# Patient Record
Sex: Female | Born: 1990 | Race: White | Hispanic: No | Marital: Married | Smoking: Never smoker
Health system: Southern US, Community
[De-identification: ages and names within clinical notes are randomized; demographics above are authoritative.]

## PROBLEM LIST (undated history)

## (undated) DIAGNOSIS — O139 Gestational [pregnancy-induced] hypertension without significant proteinuria, unspecified trimester: Secondary | ICD-10-CM

## (undated) DIAGNOSIS — J810 Acute pulmonary edema: Secondary | ICD-10-CM

## (undated) DIAGNOSIS — Z98891 History of uterine scar from previous surgery: Secondary | ICD-10-CM

## (undated) HISTORY — PX: OTHER SURGICAL HISTORY: SHX169

## (undated) HISTORY — PX: WISDOM TOOTH EXTRACTION: SHX21

---

## 2017-06-05 ENCOUNTER — Inpatient Hospital Stay (HOSPITAL_COMMUNITY)
Admission: AD | Admit: 2017-06-05 | Discharge: 2017-06-05 | Disposition: A | Discharge status: Home / Self Care | Payer: BC Managed Care – PPO | Source: Ambulatory Visit | Attending: Obstetrics and Gynecology | Admitting: Obstetrics and Gynecology

## 2017-06-05 ENCOUNTER — Encounter (HOSPITAL_COMMUNITY): Payer: Self-pay | Admitting: *Deleted

## 2017-06-05 DIAGNOSIS — O133 Gestational [pregnancy-induced] hypertension without significant proteinuria, third trimester: Secondary | ICD-10-CM | POA: Diagnosis not present

## 2017-06-05 DIAGNOSIS — Z3A29 29 weeks gestation of pregnancy: Secondary | ICD-10-CM | POA: Diagnosis not present

## 2017-06-05 DIAGNOSIS — O163 Unspecified maternal hypertension, third trimester: Secondary | ICD-10-CM | POA: Insufficient documentation

## 2017-06-05 LAB — COMPREHENSIVE METABOLIC PANEL
ALK PHOS: 80 U/L (ref 38–126)
ALT: 13 U/L — ABNORMAL LOW (ref 14–54)
ANION GAP: 9 (ref 5–15)
AST: 19 U/L (ref 15–41)
Albumin: 3.2 g/dL — ABNORMAL LOW (ref 3.5–5.0)
BILIRUBIN TOTAL: 0.5 mg/dL (ref 0.3–1.2)
BUN: 9 mg/dL (ref 6–20)
CALCIUM: 9.1 mg/dL (ref 8.9–10.3)
CO2: 21 mmol/L — AB (ref 22–32)
Chloride: 106 mmol/L (ref 101–111)
Creatinine, Ser: 0.77 mg/dL (ref 0.44–1.00)
Glucose, Bld: 86 mg/dL (ref 65–99)
Potassium: 3.8 mmol/L (ref 3.5–5.1)
SODIUM: 136 mmol/L (ref 135–145)
Total Protein: 7.1 g/dL (ref 6.5–8.1)

## 2017-06-05 LAB — CBC
HCT: 35.5 % — ABNORMAL LOW (ref 36.0–46.0)
HEMOGLOBIN: 12.6 g/dL (ref 12.0–15.0)
MCH: 31.9 pg (ref 26.0–34.0)
MCHC: 35.5 g/dL (ref 30.0–36.0)
MCV: 89.9 fL (ref 78.0–100.0)
Platelets: 209 10*3/uL (ref 150–400)
RBC: 3.95 MIL/uL (ref 3.87–5.11)
RDW: 12.8 % (ref 11.5–15.5)
WBC: 9.3 10*3/uL (ref 4.0–10.5)

## 2017-06-05 LAB — URINALYSIS, ROUTINE W REFLEX MICROSCOPIC
Bilirubin Urine: NEGATIVE
GLUCOSE, UA: NEGATIVE mg/dL
Hgb urine dipstick: NEGATIVE
KETONES UR: NEGATIVE mg/dL
LEUKOCYTES UA: NEGATIVE
Nitrite: NEGATIVE
PH: 6 (ref 5.0–8.0)
Protein, ur: 30 mg/dL — AB
Specific Gravity, Urine: 1.026 (ref 1.005–1.030)

## 2017-06-05 LAB — PROTEIN / CREATININE RATIO, URINE
Creatinine, Urine: 230 mg/dL
PROTEIN CREATININE RATIO: 0.12 mg/mg{creat} (ref 0.00–0.15)
Total Protein, Urine: 28 mg/dL

## 2017-06-05 MED ORDER — LABETALOL HCL 100 MG PO TABS
100.0000 mg | ORAL_TABLET | Freq: Once | ORAL | Status: AC
  Administered 2017-06-05: 100 mg via ORAL
  Filled 2017-06-05: qty 1

## 2017-06-05 MED ORDER — LABETALOL HCL 100 MG PO TABS: 100.0000 mg | ORAL_TABLET | Freq: Two times a day (BID) | ORAL | Status: DC

## 2017-06-05 MED ORDER — LABETALOL HCL 100 MG PO TABS: 100.0000 mg | ORAL_TABLET | Freq: Two times a day (BID) | ORAL | 1 refills | Status: DC

## 2017-06-05 NOTE — Progress Notes (Signed)
Printed AVS given to patient along with printed Rx.  24hr urine collection container given and pt verbalized understanding of instructions.  Pt dcd home in good condition.

## 2017-06-05 NOTE — MAU Provider Note (Signed)
History     CSN: [id number]  Arrival date and time: 06/05/17 1648   First Provider Initiated Contact with Patient 06/05/17 1729      Chief Complaint  Patient presents with  . Hypertension   Nina Kent is a 26 y.o. G1P0 at [redacted]w[redacted]d sent from routine prenatal visit for further evaluation of blood pressure elevation. She denies headache, visual disturbance, epigastric pain. She reports hand edema. [place] today 73rd %ile growth. Denies contractions, leakage of fluid, vaginal bleeding. Reports good fetal movement. Pregnancy course has been otherwise uncomplicated.       History reviewed. No pertinent past medical history.  Past Surgical History:  Procedure Laterality Date  . WISDOM TOOTH EXTRACTION      History reviewed. No pertinent family history.  Social History  Substance Use Topics  . Smoking status: Never Smoker  . Smokeless tobacco: Never Used  . Alcohol use No    Allergies: No Known Allergies  No prescriptions prior to admission.    Review of Systems  Constitutional: Negative for fatigue and fever.  Eyes: Negative for visual disturbance.  Respiratory: Negative for shortness of breath.   Gastrointestinal: Negative for abdominal pain.  Genitourinary: Negative for dysuria, vaginal bleeding and vaginal discharge.  Neurological: Negative for light-headedness.  Psychiatric/Behavioral: The patient is not nervous/anxious.    Physical Exam   Blood pressure (!) 143/93, pulse 97, temperature 98.5 F (36.9 C), temperature source Oral, resp. rate 17, height  (1.702 m), weight 219 lb (99.3 kg), SpO2 98 %. Patient Vitals for the past 24 hrs:  BP Temp Temp src Pulse Resp SpO2 Height Weight  06/05/17 1720 (!) 143/93 - - 97 - - - -  06/05/17 1702 (!) 149/100 98.5 F (36.9 C) Oral 91 17 98 %  (1.702 m) 219 lb (99.3 kg)    Physical Exam  Nursing note and vitals reviewed. Constitutional: She is oriented to person, place, and time. She appears well-developed and  well-nourished. No distress.  HENT:  Head: Normocephalic.  Eyes: No scleral icterus.  Neck: Neck supple.  Cardiovascular: Normal rate.   Respiratory: Effort normal.  GI: Soft. There is no tenderness.  Musculoskeletal: She exhibits edema.  2+ pretibial  Neurological: She is alert and oriented to person, place, and time. She has normal reflexes.  Skin: Skin is warm and dry.  Psychiatric: She has a normal mood and affect. Her behavior is normal. Thought content normal.    MAU Course  Procedures   Fetal monitoring Baseline FHR 150-155, moderate variability, accelerations present, no decelerations Toco: No contractions  Results for orders placed or performed during the hospital encounter of 06/05/17 (from the past 24 hour(s))  Protein / creatinine ratio, urine     Status: None   Collection Time: 06/05/17  5:04 PM  Result Value Ref Range   Creatinine, Urine 230.00 mg/dL   Total Protein, Urine 28 mg/dL   Protein Creatinine Ratio 0.12 0.00 - 0.15 mg/mg[Cre]  Urinalysis, Routine w reflex microscopic     Status: Abnormal   Collection Time: 06/05/17  5:04 PM  Result Value Ref Range   Color, Urine YELLOW YELLOW   APPearance CLOUDY (A) CLEAR   Specific Gravity, Urine 1.026 1.005 - 1.030   pH 6.0 5.0 - 8.0   Glucose, UA NEGATIVE NEGATIVE mg/dL   Hgb urine dipstick NEGATIVE NEGATIVE   Bilirubin Urine NEGATIVE NEGATIVE   Ketones, ur NEGATIVE NEGATIVE mg/dL   Protein, ur 30 (A) NEGATIVE mg/dL   Nitrite NEGATIVE NEGATIVE  Leukocytes, UA NEGATIVE NEGATIVE   RBC / HPF 0-5 0 - 5 RBC/hpf   WBC, UA 0-5 0 - 5 WBC/hpf   Bacteria, UA FEW (A) NONE SEEN   Squamous Epithelial / LPF 0-5 (A) NONE SEEN   Mucus PRESENT   CBC     Status: Abnormal   Collection Time: 06/05/17  5:08 PM  Result Value Ref Range   WBC 9.3 4.0 - 10.5 K/uL   RBC 3.95 3.87 - 5.11 MIL/uL   Hemoglobin 12.6 12.0 - 15.0 g/dL   HCT 65.7 (L) 84.6 - 96.2 %   MCV 89.9 78.0 - 100.0 fL   MCH 31.9 26.0 - 34.0 pg   MCHC 35.5 30.0  - 36.0 g/dL   RDW 95.2 84.1 - 32.4 %   Platelets 209 150 - 400 K/uL  Comprehensive metabolic panel     Status: Abnormal   Collection Time: 06/05/17  5:08 PM  Result Value Ref Range   Sodium 136 135 - 145 mmol/L   Potassium 3.8 3.5 - 5.1 mmol/L   Chloride 106 101 - 111 mmol/L   CO2 21 (L) 22 - 32 mmol/L   Glucose, Bld 86 65 - 99 mg/dL   BUN 9 6 - 20 mg/dL   Creatinine, Ser 4.01 0.44 - 1.00 mg/dL   Calcium 9.1 8.9 - 02.7 mg/dL   Total Protein 7.1 6.5 - 8.1 g/dL   Albumin 3.2 (L) 3.5 - 5.0 g/dL   AST 19 15 - 41 U/L   ALT 13 (L) 14 - 54 U/L   Alkaline Phosphatase 80 38 - 126 U/L   Total Bilirubin 0.5 0.3 - 1.2 mg/dL   GFR calc non Af Amer >60 >60 mL/min   GFR calc Af Amer >60 >60 mL/min   Anion gap 9 5 - 15   C/W Dr. Mindi Slicker start labetalol with first dose here and do 24 hr urine  Assessment and Plan  26 yo G1at [redacted]w[redacted]d 1. Hypertension affecting pregnancy in third trimester    Allergies as of 06/05/2017   No Known Allergies     Medication List    TAKE these medications   labetalol 100 MG tablet Commonly known as:  NORMODYNE Take 1 tablet (100 mg total) by mouth 2 (two) times daily.            Discharge Care Instructions        Start     Ordered   06/05/17 0000  labetalol (NORMODYNE) 100 MG tablet  2 times daily    Question:  Supervising Provider  Answer:  Reva Bores   06/05/17 1759       Deirdre Poe CNM 06/05/2017, 5:30 PM

## 2017-06-05 NOTE — MAU Note (Signed)
Pt reports her doctor sent her over to have bloodwork done because her b/p was elevated. Denies headache or visual changes.

## 2017-06-05 NOTE — Discharge Instructions (Signed)
Hypertension During Pregnancy °Hypertension, commonly called high blood pressure, is when the force of blood pumping through your arteries is too strong. Arteries are blood vessels that carry blood from the heart throughout the body. Hypertension during pregnancy can cause problems for you and your baby. Your baby may be born early (prematurely) or may not weigh as much as he or she should at birth. Very bad cases of hypertension during pregnancy can be life-threatening. °Different types of hypertension can occur during pregnancy. These include: °· Chronic hypertension. This happens when: °? You have hypertension before pregnancy and it continues during pregnancy. °? You develop hypertension before you are [redacted] weeks pregnant, and it continues during pregnancy. °· Gestational hypertension. This is hypertension that develops after the 20th week of pregnancy. °· Preeclampsia, also called toxemia of pregnancy. This is a very serious type of hypertension that develops only during pregnancy. It affects the whole body, and it can be very dangerous for you and your baby. ° °Gestational hypertension and preeclampsia usually go away within 6 weeks after your baby is born. Women who have hypertension during pregnancy have a greater chance of developing hypertension later in life or during future pregnancies. °What are the causes? °The exact cause of hypertension is not known. °What increases the risk? °There are certain factors that make it more likely for you to develop hypertension during pregnancy. These include: °· Having hypertension during a previous pregnancy or prior to pregnancy. °· Being overweight. °· Being older than age 40. °· Being pregnant for the first time or being pregnant with more than one baby. °· Becoming pregnant using fertilization methods such as IVF (in vitro fertilization). °· Having diabetes, kidney problems, or systemic lupus erythematosus. °· Having a family history of hypertension. ° °What are the  signs or symptoms? °Chronic hypertension and gestational hypertension rarely cause symptoms. Preeclampsia causes symptoms, which may include: °· Increased protein in your urine. Your health care provider will check for this at every visit before you give birth (prenatal visit). °· Severe headaches. °· Sudden weight gain. °· Swelling of the hands, face, legs, and feet. °· Nausea and vomiting. °· Vision problems, such as blurred or double vision. °· Numbness in the face, arms, legs, and feet. °· Dizziness. °· Slurred speech. °· Sensitivity to bright lights. °· Abdominal pain. °· Convulsions. ° °How is this diagnosed? °You may be diagnosed with hypertension during a routine prenatal exam. At each prenatal visit, you may: °· Have a urine test to check for high amounts of protein in your urine. °· Have your blood pressure checked. A blood pressure reading is recorded as two numbers, such as "120 over 80" (or 120/80). The first ("top") number is called the systolic pressure. It is a measure of the pressure in your arteries when your heart beats. The second ("bottom") number is called the diastolic pressure. It is a measure of the pressure in your arteries as your heart relaxes between beats. Blood pressure is measured in a unit called mm Hg. A normal blood pressure reading is: °? Systolic: below 120. °? Diastolic: below 80. ° °The type of hypertension that you are diagnosed with depends on your test results and when your symptoms developed. °· Chronic hypertension is usually diagnosed before 20 weeks of pregnancy. °· Gestational hypertension is usually diagnosed after 20 weeks of pregnancy. °· Hypertension with high amounts of protein in the urine is diagnosed as preeclampsia. °· Blood pressure measurements that stay above 160 systolic, or above 110 diastolic, are   signs of severe preeclampsia. ° °How is this treated? °Treatment for hypertension during pregnancy varies depending on the type of hypertension you have and how  serious it is. °· If you take medicines called ACE inhibitors to treat chronic hypertension, you may need to switch medicines. ACE inhibitors should not be taken during pregnancy. °· If you have gestational hypertension, you may need to take blood pressure medicine. °· If you are at risk for preeclampsia, your health care provider may recommend that you take a low-dose aspirin every day to prevent high blood pressure during your pregnancy. °· If you have severe preeclampsia, you may need to be hospitalized so you and your baby can be monitored closely. You may also need to take medicine (magnesium sulfate) to prevent seizures and to lower blood pressure. This medicine may be given as an injection or through an IV tube. °· In some cases, if your condition gets worse, you may need to deliver your baby early. ° °Follow these instructions at home: °Eating and drinking °· Drink enough fluid to keep your urine clear or pale yellow. °· Eat a healthy diet that is low in salt (sodium). Do not add salt to your food. Check food labels to see how much sodium a food or beverage contains. °Lifestyle °· Do not use any products that contain nicotine or tobacco, such as cigarettes and e-cigarettes. If you need help quitting, ask your health care provider. °· Do not use alcohol. °· Avoid caffeine. °· Avoid stress as much as possible. Rest and get plenty of sleep. °General instructions °· Take over-the-counter and prescription medicines only as told by your health care provider. °· While lying down, lie on your left side. This keeps pressure off your baby. °· While sitting or lying down, raise (elevate) your feet. Try putting some pillows under your lower legs. °· Exercise regularly. Ask your health care provider what kinds of exercise are best for you. °· Keep all prenatal and follow-up visits as told by your health care provider. This is important. °Contact a health care provider if: °· You have symptoms that your health care  provider told you may require more treatment or monitoring, such as: °? Fever. °? Vomiting. °? Headache. °Get help right away if: °· You have severe abdominal pain or vomiting that does not get better with treatment. °· You suddenly develop swelling in your hands, ankles, or face. °· You gain 4 lbs (1.8 kg) or more in 1 week. °· You develop vaginal bleeding, or you have blood in your urine. °· You do not feel your baby moving as much as usual. °· You have blurred or double vision. °· You have muscle twitching or sudden tightening (spasms). °· You have shortness of breath. °· Your lips or fingernails turn blue. °This information is not intended to replace advice given to you by your health care provider. Make sure you discuss any questions you have with your health care provider. °Document Released: 05/24/2011 Document Revised: 03/25/2016 Document Reviewed: 02/19/2016 °Elsevier Interactive Patient Education © 2018 [employer] ° °

## 2017-06-13 ENCOUNTER — Encounter (HOSPITAL_COMMUNITY): Payer: Self-pay

## 2017-06-13 ENCOUNTER — Inpatient Hospital Stay (HOSPITAL_COMMUNITY)
Admission: AD | Admit: 2017-06-13 | Discharge: 2017-06-20 | DRG: 786 | Disposition: A | Discharge status: Home / Self Care | Payer: BC Managed Care – PPO | Source: Ambulatory Visit | Attending: Obstetrics and Gynecology | Admitting: Obstetrics and Gynecology

## 2017-06-13 DIAGNOSIS — O321XX0 Maternal care for breech presentation, not applicable or unspecified: Secondary | ICD-10-CM | POA: Diagnosis present

## 2017-06-13 DIAGNOSIS — R030 Elevated blood-pressure reading, without diagnosis of hypertension: Secondary | ICD-10-CM | POA: Diagnosis present

## 2017-06-13 DIAGNOSIS — Z8709 Personal history of other diseases of the respiratory system: Secondary | ICD-10-CM

## 2017-06-13 DIAGNOSIS — R7981 Abnormal blood-gas level: Secondary | ICD-10-CM

## 2017-06-13 DIAGNOSIS — Z23 Encounter for immunization: Secondary | ICD-10-CM

## 2017-06-13 DIAGNOSIS — Z3A30 30 weeks gestation of pregnancy: Secondary | ICD-10-CM | POA: Diagnosis not present

## 2017-06-13 DIAGNOSIS — J810 Acute pulmonary edema: Secondary | ICD-10-CM | POA: Diagnosis not present

## 2017-06-13 DIAGNOSIS — Z98891 History of uterine scar from previous surgery: Secondary | ICD-10-CM

## 2017-06-13 DIAGNOSIS — O1414 Severe pre-eclampsia complicating childbirth: Secondary | ICD-10-CM | POA: Diagnosis present

## 2017-06-13 DIAGNOSIS — O1490 Unspecified pre-eclampsia, unspecified trimester: Secondary | ICD-10-CM | POA: Diagnosis present

## 2017-06-13 DIAGNOSIS — R0602 Shortness of breath: Secondary | ICD-10-CM

## 2017-06-13 HISTORY — DX: Acute pulmonary edema: J81.0

## 2017-06-13 HISTORY — DX: History of uterine scar from previous surgery: Z98.891

## 2017-06-13 LAB — CBC
HCT: 34.2 % — ABNORMAL LOW (ref 36.0–46.0)
Hemoglobin: 12.1 g/dL (ref 12.0–15.0)
MCH: 31.8 pg (ref 26.0–34.0)
MCHC: 35.4 g/dL (ref 30.0–36.0)
MCV: 90 fL (ref 78.0–100.0)
Platelets: 170 10*3/uL (ref 150–400)
RBC: 3.8 MIL/uL — AB (ref 3.87–5.11)
RDW: 12.8 % (ref 11.5–15.5)
WBC: 9.1 10*3/uL (ref 4.0–10.5)

## 2017-06-13 LAB — TYPE AND SCREEN
ABO/RH(D): O POS
Antibody Screen: NEGATIVE

## 2017-06-13 LAB — DIFFERENTIAL
BASOS ABS: 0 10*3/uL (ref 0.0–0.1)
Basophils Relative: 0 %
Eosinophils Absolute: 0.1 10*3/uL (ref 0.0–0.7)
Eosinophils Relative: 1 %
LYMPHS ABS: 2 10*3/uL (ref 0.7–4.0)
Lymphocytes Relative: 22 %
MONO ABS: 0.3 10*3/uL (ref 0.1–1.0)
MONOS PCT: 3 %
NEUTROS ABS: 6.6 10*3/uL (ref 1.7–7.7)
Neutrophils Relative %: 74 %

## 2017-06-13 LAB — RAPID HIV SCREEN (HIV 1/2 AB+AG)
HIV 1/2 ANTIBODIES: NONREACTIVE
HIV-1 P24 ANTIGEN - HIV24: NONREACTIVE

## 2017-06-13 LAB — COMPREHENSIVE METABOLIC PANEL
ALT: 14 U/L (ref 14–54)
ANION GAP: 13 (ref 5–15)
AST: 18 U/L (ref 15–41)
Albumin: 2.9 g/dL — ABNORMAL LOW (ref 3.5–5.0)
Alkaline Phosphatase: 76 U/L (ref 38–126)
BILIRUBIN TOTAL: 0.3 mg/dL (ref 0.3–1.2)
BUN: 9 mg/dL (ref 6–20)
CO2: 18 mmol/L — AB (ref 22–32)
Calcium: 9 mg/dL (ref 8.9–10.3)
Chloride: 105 mmol/L (ref 101–111)
Creatinine, Ser: 0.66 mg/dL (ref 0.44–1.00)
GFR calc Af Amer: >60 mL/min (ref >60)
GLUCOSE: 80 mg/dL (ref 65–99)
POTASSIUM: 3.8 mmol/L (ref 3.5–5.1)
Sodium: 136 mmol/L (ref 135–145)
Total Protein: 6.3 g/dL — ABNORMAL LOW (ref 6.5–8.1)

## 2017-06-13 LAB — ABO/RH: ABO/RH(D): O POS

## 2017-06-13 LAB — HEPATITIS B SURFACE ANTIGEN: Hepatitis B Surface Ag: NEGATIVE

## 2017-06-13 MED ORDER — DOCUSATE SODIUM 100 MG PO CAPS
100.0000 mg | ORAL_CAPSULE | Freq: Every day | ORAL | Status: DC
  Administered 2017-06-14 – 2017-06-20 (×7): 100 mg via ORAL
  Filled 2017-06-13 (×7): qty 1

## 2017-06-13 MED ORDER — LABETALOL HCL 100 MG PO TABS: 100.0000 mg | ORAL_TABLET | Freq: Two times a day (BID) | ORAL | Status: DC

## 2017-06-13 MED ORDER — SODIUM CHLORIDE 0.9% FLUSH
3.0000 mL | Freq: Two times a day (BID) | INTRAVENOUS | Status: DC
  Administered 2017-06-13 – 2017-06-19 (×9): 3 mL via INTRAVENOUS

## 2017-06-13 MED ORDER — SODIUM CHLORIDE 0.9% FLUSH: 3.0000 mL | INTRAVENOUS | Status: DC | PRN

## 2017-06-13 MED ORDER — BETAMETHASONE SOD PHOS & ACET 6 (3-3) MG/ML IJ SUSP
12.0000 mg | INTRAMUSCULAR | Status: AC
  Administered 2017-06-13 – 2017-06-14 (×2): 12 mg via INTRAMUSCULAR
  Filled 2017-06-13 (×2): qty 2

## 2017-06-13 MED ORDER — LABETALOL HCL 100 MG PO TABS
100.0000 mg | ORAL_TABLET | Freq: Three times a day (TID) | ORAL | Status: DC
  Administered 2017-06-13 – 2017-06-15 (×7): 100 mg via ORAL
  Filled 2017-06-13 (×7): qty 1

## 2017-06-13 MED ORDER — PRENATAL MULTIVITAMIN CH
1.0000 | ORAL_TABLET | Freq: Every day | ORAL | Status: DC
  Administered 2017-06-14 – 2017-06-20 (×7): 1 via ORAL
  Filled 2017-06-13 (×7): qty 1

## 2017-06-13 MED ORDER — INFLUENZA VAC SPLIT QUAD 0.5 ML IM SUSY
0.5000 mL | PREFILLED_SYRINGE | INTRAMUSCULAR | Status: AC
  Administered 2017-06-14: 0.5 mL via INTRAMUSCULAR
  Filled 2017-06-13: qty 0.5

## 2017-06-13 MED ORDER — HYDRALAZINE HCL 20 MG/ML IJ SOLN: 10.0000 mg | Freq: Once | INTRAMUSCULAR | Status: DC | PRN

## 2017-06-13 MED ORDER — CALCIUM CARBONATE ANTACID 500 MG PO CHEW
2.0000 | CHEWABLE_TABLET | ORAL | Status: DC | PRN
  Administered 2017-06-19: 400 mg via ORAL
  Filled 2017-06-13: qty 2

## 2017-06-13 MED ORDER — LABETALOL HCL 5 MG/ML IV SOLN
20.0000 mg | INTRAVENOUS | Status: DC | PRN
  Administered 2017-06-19: 80 mg via INTRAVENOUS
  Filled 2017-06-13: qty 8
  Filled 2017-06-13: qty 4
  Filled 2017-06-13: qty 16

## 2017-06-13 MED ORDER — ACETAMINOPHEN 325 MG PO TABS
650.0000 mg | ORAL_TABLET | ORAL | Status: DC | PRN
  Administered 2017-06-18 – 2017-06-19 (×3): 650 mg via ORAL
  Filled 2017-06-13 (×3): qty 2

## 2017-06-13 MED ORDER — SODIUM CHLORIDE 0.9 % IV SOLN: 250.0000 mL | INTRAVENOUS | Status: DC | PRN

## 2017-06-13 MED ORDER — ZOLPIDEM TARTRATE 5 MG PO TABS: 5.0000 mg | ORAL_TABLET | Freq: Every evening | ORAL | Status: DC | PRN

## 2017-06-13 NOTE — H&P (Signed)
Nina Kent is a 26 y.o. female G1P0 at 73 4/7 weeks (EDD 08/18/17 by LMP c/w 9 week [place])  presenting from office with elevated blood pressures and 3+ proteinuria.  Pt has been followed for gestational hypertension since 29 weeks and was started on labetalol  po TID and PIH labs and 24 hour protein were WNL on 06/07/17.  She had an [place] 06/05/17 which showed SIUP, 73% 3lbs 11oz, nl afi, vertex, 3vc.  She is asymptomatic with no HA but today in office serial BP did not improve to less than 150/105 despite being on labetalol, a 7 lb weight gain in 4 days and new 3+proteinuria. Prenatal care otherwise uncomplicated except rubella non-immune   OB History    Gravida Para Term Preterm AB Living   1             SAB TAB Ectopic Multiple Live Births                 History reviewed. No pertinent past medical history. Past Surgical History:  Procedure Laterality Date  . WISDOM TOOTH EXTRACTION     Family History: family history is not on file. Social History:  reports that she has never smoked. She has never used smokeless tobacco. She reports that she does not drink alcohol or use drugs.     Maternal Diabetes: No Genetic Screening: Normal Maternal Ultrasounds/Referrals: Normal Fetal Ultrasounds or other Referrals:  None Maternal Substance Abuse:  No Significant Maternal Medications:  Meds include: Other: labetalol Significant Maternal Lab Results:  None Other Comments:  None  Review of Systems  Gastrointestinal: Negative for abdominal pain.  Neurological: Negative for headaches.   Maternal Medical History:  Contractions: Frequency: rare.   Perceived severity is mild.    Fetal activity: Perceived fetal activity is normal.    Prenatal complications: PIH and pre-eclampsia.   Prenatal Complications - Diabetes: none.      Blood pressure (!) 165/101, pulse 89, temperature 98.3 F (36.8 C), temperature source Oral, resp. rate 16, height  (1.702 m), weight 102.5 kg (226 lb),  SpO2 98 %. Maternal Exam:  Uterine Assessment: Contraction strength is mild.  Contraction frequency is rare.   Abdomen: Patient reports no abdominal tenderness. Fetal presentation: vertex  Introitus: Normal vulva. Normal vagina.    Physical Exam  Constitutional: She appears well-developed.  Cardiovascular: Normal rate and regular rhythm.   Respiratory: Effort normal.  GI: Soft.  Musculoskeletal: She exhibits edema.  Neurological: She is alert.    Prenatal labs: ABO, Rh: --/--/O POS, O POS (09/25 1901) Antibody: NEG (09/25 1901) Rubella:  Non immune RPR:   NR HBsAg:   neg HIV:   NR GBS:   unknown First trimester screen WNL One hour GCT   Assessment/Plan: Pt admitted for steroids and BP regulation. Will confirm preeclamptic with 24 hour urine but clinical diagnosis is preeclampsia with some intermittent severe range BP on medication.   If BP can be controlled, will assess if needs inpatient or outpatient management based on FHR trracings, symptoms and labs.     Oliver Pila 06/13/2017, 9:52 PM

## 2017-06-14 LAB — CBC
HEMATOCRIT: 34.1 % — AB (ref 36.0–46.0)
Hemoglobin: 12.1 g/dL (ref 12.0–15.0)
MCH: 31.8 pg (ref 26.0–34.0)
MCHC: 35.5 g/dL (ref 30.0–36.0)
MCV: 89.7 fL (ref 78.0–100.0)
PLATELETS: 160 10*3/uL (ref 150–400)
RBC: 3.8 MIL/uL — ABNORMAL LOW (ref 3.87–5.11)
RDW: 12.7 % (ref 11.5–15.5)
WBC: 9.2 10*3/uL (ref 4.0–10.5)

## 2017-06-14 LAB — COMPREHENSIVE METABOLIC PANEL
ALBUMIN: 2.8 g/dL — AB (ref 3.5–5.0)
ALK PHOS: 80 U/L (ref 38–126)
ALT: 14 U/L (ref 14–54)
ANION GAP: 10 (ref 5–15)
AST: 21 U/L (ref 15–41)
BILIRUBIN TOTAL: 0.9 mg/dL (ref 0.3–1.2)
BUN: 12 mg/dL (ref 6–20)
CALCIUM: 8.7 mg/dL — AB (ref 8.9–10.3)
CO2: 18 mmol/L — ABNORMAL LOW (ref 22–32)
Chloride: 107 mmol/L (ref 101–111)
Creatinine, Ser: 0.71 mg/dL (ref 0.44–1.00)
Glucose, Bld: 109 mg/dL — ABNORMAL HIGH (ref 65–99)
POTASSIUM: 4.3 mmol/L (ref 3.5–5.1)
Sodium: 135 mmol/L (ref 135–145)
TOTAL PROTEIN: 6.2 g/dL — AB (ref 6.5–8.1)

## 2017-06-14 NOTE — Progress Notes (Signed)
Patient ID: Nina Kent, female   DOB: 1991/07/03, 25 y.o.   MRN: [id number] HD #2, [redacted]W[redacted]D, possible preeclampsia Feels ok, no problems, no HA, +FM Afeb, VSS, BP 130-140/90 overnight FHT- 130-140, mod variability, + accels, occasional variable decel, Cat II Labs stable Will continue on Labetalol 100 mg po tid and monitor BP closely, finish 24 hr urine collection, to get 2nd BMZ later today, keep on continuous monitoring since has some variable decels, NICU consult

## 2017-06-14 NOTE — Progress Notes (Signed)
Nothing new BP stable FHT-130s, mod variability, + accels, rare variable-one ?prolonged, Cat II Will continue current management, waiting for 24 hr urine result

## 2017-06-14 NOTE — Progress Notes (Signed)
Patient ID: Nina Kent, female   DOB: 04/23/1991, 26 y.o.   MRN: [id number] Labs:  AST and ALT WNL            Platelets 170K  BP 140/90's with po labetalol FHR category 1 overall but some intermittent variables  Betamethasone #1 given about 2000 24 hour urine in progress

## 2017-06-14 NOTE — Consult Note (Addendum)
Neonatology Consult Note:  Asked by Dr Jackelyn Knife to consult on Nina Kent for prematurity at 30 wks with preeclampsia. Chart reviewed. I spoke to Nina Kent in her room with her husband present. Her mom was also present and she had chosen for her mom to stay for the consult.  I discussed our presence at delivery, assessment at birth, and resuscitation if needed. I talked about general outcome at 30 wks with complications of prematurity, immature lungs, benefits of betamethasone and possible need for O2 support. I discussed nutrition and talked about the benefits of breast milk to baby's outcome.I also talked about expected  LOS.   Thank you for inviting [place] to be a part of this patient's care.   Lucillie Garfinkel MD Neonatologist

## 2017-06-14 NOTE — Progress Notes (Signed)
UR chart review completed.  

## 2017-06-15 LAB — HEPATIC FUNCTION PANEL
ALT: 14 U/L (ref 14–54)
AST: 20 U/L (ref 15–41)
Albumin: 2.9 g/dL — ABNORMAL LOW (ref 3.5–5.0)
Alkaline Phosphatase: 76 U/L (ref 38–126)
TOTAL PROTEIN: 5.8 g/dL — AB (ref 6.5–8.1)
Total Bilirubin: 0.7 mg/dL (ref 0.3–1.2)

## 2017-06-15 LAB — CBC
HCT: 33.2 % — ABNORMAL LOW (ref 36.0–46.0)
Hemoglobin: 11.5 g/dL — ABNORMAL LOW (ref 12.0–15.0)
MCH: 31.7 pg (ref 26.0–34.0)
MCHC: 34.6 g/dL (ref 30.0–36.0)
MCV: 91.5 fL (ref 78.0–100.0)
PLATELETS: 174 10*3/uL (ref 150–400)
RBC: 3.63 MIL/uL — ABNORMAL LOW (ref 3.87–5.11)
RDW: 12.8 % (ref 11.5–15.5)
WBC: 9.9 10*3/uL (ref 4.0–10.5)

## 2017-06-15 LAB — PROTEIN, URINE, 24 HOUR
COLLECTION INTERVAL-UPROT: 24 h
Protein, 24H Urine: 4284 mg/d — ABNORMAL HIGH (ref 50–100)
Protein, Urine: 612 mg/dL
URINE TOTAL VOLUME-UPROT: 700 mL

## 2017-06-15 LAB — LACTATE DEHYDROGENASE: LDH: 131 U/L (ref 98–192)

## 2017-06-15 LAB — URIC ACID: Uric Acid, Serum: 8.8 mg/dL — ABNORMAL HIGH (ref 2.3–6.6)

## 2017-06-15 MED ORDER — LABETALOL HCL 5 MG/ML IV SOLN: 20.0000 mg | INTRAVENOUS | Status: DC | PRN

## 2017-06-15 MED ORDER — DOXYLAMINE-PYRIDOXINE ER 20-20 MG PO TBCR
1.0000 | EXTENDED_RELEASE_TABLET | Freq: Two times a day (BID) | ORAL | Status: DC
  Administered 2017-06-16 – 2017-06-17 (×3): 1 via ORAL

## 2017-06-15 MED ORDER — MAGNESIUM SULFATE BOLUS VIA INFUSION
4.0000 g | Freq: Once | INTRAVENOUS | Status: AC
  Administered 2017-06-16: 4 g via INTRAVENOUS
  Filled 2017-06-15: qty 500

## 2017-06-15 MED ORDER — HYDRALAZINE HCL 20 MG/ML IJ SOLN: 10.0000 mg | Freq: Once | INTRAMUSCULAR | Status: DC | PRN

## 2017-06-15 MED ORDER — MAGNESIUM SULFATE 40 G IN LACTATED RINGERS - SIMPLE
1.0000 g/h | INTRAVENOUS | Status: DC
Start: 2017-06-15 — End: 2017-06-20
  Administered 2017-06-16: 1 g/h via INTRAVENOUS
  Filled 2017-06-15: qty 40

## 2017-06-15 NOTE — Progress Notes (Addendum)
Patient ID: Nina Kent, female   DOB: 1991/03/08, 26 y.o.   MRN: [id number]  Late entry Pt doing well with complaint of "tired of laying in bed" Asks to be able to ambulate. Denies headaches or blurry vision. +FMs VS - 150-152/96-99 EFM - cat 1, rare variable; baseline 150 TOCO - no contractions SVE - deferred  A/P: Prime at 30 6/7wks with preclampsia         Repeat labs ordered now         Continue to monitor BP         S/P BMZ 9/25 and 9/26         MgSO4 if indicated by BP         Continue continuous monitoring

## 2017-06-15 NOTE — Progress Notes (Signed)
Patient ID: Nina Kent, female   DOB: Aug 24, 1991, 26 y.o.   MRN: [id number] Pt doing well. Denies HAs, blurry vision or contractions. +FMs. Received iv labetalol overnight VS  132 -141/83-91 EFM - cat 1, occasional variables, 145 TOCO - no ctxs SVE - deferred  24hr urine collection 4284 protein  A/P: Prime at 30 6/[redacted]wks gestation with preeclampsia         S/P BMZ on 9/25 and 9/26         Continue on labetalol  po TID         S/p NICU consult         Likely delivery at [redacted]weeks gestation

## 2017-06-15 NOTE — Progress Notes (Signed)
Patient ID: Nina Kent, female   DOB: Oct 28, 1990, 26 y.o.   MRN: [id number] Pt continues to deny any HAs, blurry vision.  BP still 150s/90s 2hrs after labetalol which is elevated from this am.  Repeat labs stable CAT 1 strip Will start on MgSO4 for CP prophylaxis Will manage BP more aggresively with parameters as follows SBP>160 DBP >100

## 2017-06-15 NOTE — Progress Notes (Signed)
Dr. Mindi Slicker notified of pt N/V with two episodes of emesis. Pt requests to be started on home med, [place]. MD gave orders for [place]  BID, pt to bring med from home.

## 2017-06-15 NOTE — Progress Notes (Signed)
Dr. Mindi Slicker notified about elevated BP and variables. New orders given.

## 2017-06-16 ENCOUNTER — Encounter (HOSPITAL_COMMUNITY): Payer: Self-pay | Admitting: Anesthesiology

## 2017-06-16 ENCOUNTER — Encounter (HOSPITAL_COMMUNITY)
Admission: AD | Disposition: A | Discharge status: Home / Self Care | Payer: Self-pay | Source: Ambulatory Visit | Attending: Obstetrics and Gynecology

## 2017-06-16 ENCOUNTER — Inpatient Hospital Stay (HOSPITAL_COMMUNITY): Payer: BC Managed Care – PPO

## 2017-06-16 ENCOUNTER — Encounter (HOSPITAL_COMMUNITY): Payer: Self-pay | Admitting: *Deleted

## 2017-06-16 ENCOUNTER — Inpatient Hospital Stay (HOSPITAL_COMMUNITY): Payer: BC Managed Care – PPO | Admitting: Anesthesiology

## 2017-06-16 LAB — TYPE AND SCREEN
ABO/RH(D): O POS
ANTIBODY SCREEN: NEGATIVE

## 2017-06-16 LAB — CREATININE, SERUM
Creatinine, Ser: 0.79 mg/dL (ref 0.44–1.00)
GFR calc non Af Amer: >60 mL/min (ref >60)

## 2017-06-16 LAB — CBC
HEMATOCRIT: 31.3 % — AB (ref 36.0–46.0)
HEMATOCRIT: 33.7 % — AB (ref 36.0–46.0)
HEMOGLOBIN: 11 g/dL — AB (ref 12.0–15.0)
Hemoglobin: 11.9 g/dL — ABNORMAL LOW (ref 12.0–15.0)
MCH: 31.8 pg (ref 26.0–34.0)
MCH: 32.4 pg (ref 26.0–34.0)
MCHC: 35.1 g/dL (ref 30.0–36.0)
MCHC: 35.3 g/dL (ref 30.0–36.0)
MCV: 90.5 fL (ref 78.0–100.0)
MCV: 91.8 fL (ref 78.0–100.0)
PLATELETS: 152 10*3/uL (ref 150–400)
Platelets: 154 10*3/uL (ref 150–400)
RBC: 3.46 MIL/uL — ABNORMAL LOW (ref 3.87–5.11)
RBC: 3.67 MIL/uL — AB (ref 3.87–5.11)
RDW: 12.8 % (ref 11.5–15.5)
RDW: 12.8 % (ref 11.5–15.5)
WBC: 8.9 10*3/uL (ref 4.0–10.5)
WBC: 12.5 10*3/uL — AB (ref 4.0–10.5)

## 2017-06-16 LAB — MAGNESIUM: Magnesium: 3.8 mg/dL — ABNORMAL HIGH (ref 1.7–2.4)

## 2017-06-16 SURGERY — Surgical Case
Anesthesia: Spinal | Wound class: Clean Contaminated

## 2017-06-16 MED ORDER — MEPERIDINE HCL 25 MG/ML IJ SOLN: 6.2500 mg | INTRAMUSCULAR | Status: DC | PRN

## 2017-06-16 MED ORDER — LABETALOL HCL 5 MG/ML IV SOLN
20.0000 mg | INTRAVENOUS | Status: DC | PRN
Start: 2017-06-16 — End: 2017-06-19
  Administered 2017-06-19: 40 mg via INTRAVENOUS

## 2017-06-16 MED ORDER — DEXTROSE 5 % IV SOLN
1.0000 ug/kg/h | INTRAVENOUS | Status: DC | PRN
Start: 2017-06-16 — End: 2017-06-20

## 2017-06-16 MED ORDER — ONDANSETRON HCL 4 MG/2ML IJ SOLN
INTRAMUSCULAR | Status: AC
  Filled 2017-06-16: qty 2

## 2017-06-16 MED ORDER — LABETALOL HCL 100 MG PO TABS
100.0000 mg | ORAL_TABLET | Freq: Once | ORAL | Status: AC
  Administered 2017-06-16: 100 mg via ORAL
  Filled 2017-06-16: qty 1

## 2017-06-16 MED ORDER — ACETAMINOPHEN 500 MG PO TABS
1000.0000 mg | ORAL_TABLET | Freq: Four times a day (QID) | ORAL | Status: AC
  Administered 2017-06-16 – 2017-06-17 (×4): 1000 mg via ORAL
  Filled 2017-06-16 (×4): qty 2

## 2017-06-16 MED ORDER — ONDANSETRON HCL 4 MG/2ML IJ SOLN: 4.0000 mg | Freq: Three times a day (TID) | INTRAMUSCULAR | Status: DC | PRN

## 2017-06-16 MED ORDER — SOD CITRATE-CITRIC ACID 500-334 MG/5ML PO SOLN
ORAL | Status: AC
  Administered 2017-06-16: 30 mL
  Filled 2017-06-16: qty 15

## 2017-06-16 MED ORDER — FENTANYL CITRATE (PF) 100 MCG/2ML IJ SOLN
INTRAMUSCULAR | Status: AC
  Filled 2017-06-16: qty 2

## 2017-06-16 MED ORDER — METOCLOPRAMIDE HCL 5 MG/ML IJ SOLN: 10.0000 mg | Freq: Once | INTRAMUSCULAR | Status: DC | PRN

## 2017-06-16 MED ORDER — KETOROLAC TROMETHAMINE 30 MG/ML IJ SOLN: 30.0000 mg | Freq: Four times a day (QID) | INTRAMUSCULAR | Status: AC | PRN

## 2017-06-16 MED ORDER — MORPHINE SULFATE (PF) 0.5 MG/ML IJ SOLN
INTRAMUSCULAR | Status: AC
  Filled 2017-06-16: qty 10

## 2017-06-16 MED ORDER — DIPHENHYDRAMINE HCL 25 MG PO CAPS: 25.0000 mg | ORAL_CAPSULE | ORAL | Status: DC | PRN

## 2017-06-16 MED ORDER — NALBUPHINE HCL 10 MG/ML IJ SOLN: 5.0000 mg | INTRAMUSCULAR | Status: DC | PRN

## 2017-06-16 MED ORDER — NALBUPHINE HCL 10 MG/ML IJ SOLN: 5.0000 mg | Freq: Once | INTRAMUSCULAR | Status: DC | PRN

## 2017-06-16 MED ORDER — DEXAMETHASONE SODIUM PHOSPHATE 10 MG/ML IJ SOLN
INTRAMUSCULAR | Status: AC
  Filled 2017-06-16: qty 1

## 2017-06-16 MED ORDER — MORPHINE SULFATE (PF) 0.5 MG/ML IJ SOLN
INTRAMUSCULAR | Status: DC | PRN
  Administered 2017-06-16: .2 mg via INTRATHECAL

## 2017-06-16 MED ORDER — LACTATED RINGERS IV SOLN
INTRAVENOUS | Status: DC
  Administered 2017-06-16 – 2017-06-17 (×3): via INTRAVENOUS

## 2017-06-16 MED ORDER — DEXAMETHASONE SODIUM PHOSPHATE 10 MG/ML IJ SOLN
INTRAMUSCULAR | Status: DC | PRN
  Administered 2017-06-16: 10 mg via INTRAVENOUS

## 2017-06-16 MED ORDER — CEFAZOLIN SODIUM-DEXTROSE 2-4 GM/100ML-% IV SOLN
2.0000 g | INTRAVENOUS | Status: AC
  Administered 2017-06-16: 2 g via INTRAVENOUS
  Filled 2017-06-16: qty 100

## 2017-06-16 MED ORDER — SODIUM CHLORIDE 0.9% FLUSH
3.0000 mL | INTRAVENOUS | Status: DC | PRN
  Administered 2017-06-19: 3 mL via INTRAVENOUS
  Filled 2017-06-16: qty 3

## 2017-06-16 MED ORDER — SCOPOLAMINE 1 MG/3DAYS TD PT72: 1.0000 | MEDICATED_PATCH | Freq: Once | TRANSDERMAL | Status: DC

## 2017-06-16 MED ORDER — KETOROLAC TROMETHAMINE 30 MG/ML IJ SOLN: 30.0000 mg | Freq: Four times a day (QID) | INTRAMUSCULAR | Status: DC | PRN

## 2017-06-16 MED ORDER — SODIUM CHLORIDE 0.9% FLUSH: 3.0000 mL | INTRAVENOUS | Status: DC | PRN

## 2017-06-16 MED ORDER — LABETALOL HCL 5 MG/ML IV SOLN
INTRAVENOUS | Status: AC
  Filled 2017-06-16: qty 4

## 2017-06-16 MED ORDER — DIPHENHYDRAMINE HCL 50 MG/ML IJ SOLN: 12.5000 mg | INTRAMUSCULAR | Status: DC | PRN

## 2017-06-16 MED ORDER — HYDRALAZINE HCL 20 MG/ML IJ SOLN: 10.0000 mg | Freq: Once | INTRAMUSCULAR | Status: DC | PRN

## 2017-06-16 MED ORDER — ACETAMINOPHEN 500 MG PO TABS: 1000.0000 mg | ORAL_TABLET | Freq: Four times a day (QID) | ORAL | Status: DC

## 2017-06-16 MED ORDER — FUROSEMIDE 10 MG/ML IJ SOLN
10.0000 mg | Freq: Once | INTRAMUSCULAR | Status: AC
  Administered 2017-06-16: 10 mg via INTRAVENOUS
  Filled 2017-06-16: qty 1

## 2017-06-16 MED ORDER — NALOXONE HCL 2 MG/2ML IJ SOSY: 1.0000 ug/kg/h | PREFILLED_SYRINGE | INTRAVENOUS | Status: DC | PRN

## 2017-06-16 MED ORDER — LABETALOL HCL 200 MG PO TABS
200.0000 mg | ORAL_TABLET | Freq: Three times a day (TID) | ORAL | Status: DC
  Administered 2017-06-16 – 2017-06-18 (×8): 200 mg via ORAL
  Filled 2017-06-16 (×8): qty 1

## 2017-06-16 MED ORDER — NALOXONE HCL 0.4 MG/ML IJ SOLN: 0.4000 mg | INTRAMUSCULAR | Status: DC | PRN

## 2017-06-16 MED ORDER — FUROSEMIDE 10 MG/ML IJ SOLN
20.0000 mg | Freq: Once | INTRAMUSCULAR | Status: AC
  Administered 2017-06-16: 20 mg via INTRAVENOUS
  Filled 2017-06-16: qty 2

## 2017-06-16 MED ORDER — FENTANYL CITRATE (PF) 100 MCG/2ML IJ SOLN
INTRAMUSCULAR | Status: DC | PRN
  Administered 2017-06-16: 10 ug via INTRATHECAL

## 2017-06-16 MED ORDER — BUPIVACAINE IN DEXTROSE 0.75-8.25 % IT SOLN
INTRATHECAL | Status: AC
  Filled 2017-06-16: qty 2

## 2017-06-16 MED ORDER — LACTATED RINGERS IV SOLN: INTRAVENOUS | Status: DC

## 2017-06-16 MED ORDER — BUPIVACAINE IN DEXTROSE 0.75-8.25 % IT SOLN
INTRATHECAL | Status: DC | PRN
  Administered 2017-06-16: 1.5 mL via INTRATHECAL

## 2017-06-16 MED ORDER — SODIUM CHLORIDE 0.9 % IR SOLN
Status: DC | PRN
  Administered 2017-06-16: 1000 mL

## 2017-06-16 MED ORDER — ONDANSETRON HCL 4 MG/2ML IJ SOLN
INTRAMUSCULAR | Status: DC | PRN
  Administered 2017-06-16: 4 mg via INTRAVENOUS

## 2017-06-16 MED ORDER — LACTATED RINGERS IV SOLN
INTRAVENOUS | Status: DC | PRN
  Administered 2017-06-16: 16:00:00 via INTRAVENOUS

## 2017-06-16 MED ORDER — OXYTOCIN 10 UNIT/ML IJ SOLN
INTRAMUSCULAR | Status: AC
  Filled 2017-06-16: qty 4

## 2017-06-16 MED ORDER — FENTANYL CITRATE (PF) 100 MCG/2ML IJ SOLN: 25.0000 ug | INTRAMUSCULAR | Status: DC | PRN

## 2017-06-16 MED ORDER — OXYTOCIN 10 UNIT/ML IJ SOLN
INTRAVENOUS | Status: DC | PRN
  Administered 2017-06-16: 40 [IU] via INTRAVENOUS

## 2017-06-16 SURGICAL SUPPLY — 37 items
BENZOIN TINCTURE PRP APPL 2/3 (GAUZE/BANDAGES/DRESSINGS) ×3 IMPLANT
CHLORAPREP W/TINT 26ML (MISCELLANEOUS) ×3 IMPLANT
CLAMP CORD UMBIL (MISCELLANEOUS) IMPLANT
CLOSURE WOUND 1/2 X4 (GAUZE/BANDAGES/DRESSINGS) ×1
CLOTH BEACON ORANGE TIMEOUT ST (SAFETY) ×3 IMPLANT
CONTAINER PREFILL 10% NBF 15ML (MISCELLANEOUS) IMPLANT
DRSG OPSITE POSTOP 4X10 (GAUZE/BANDAGES/DRESSINGS) ×3 IMPLANT
ELECT REM PT RETURN 9FT ADLT (ELECTROSURGICAL) ×3
ELECTRODE REM PT RTRN 9FT ADLT (ELECTROSURGICAL) ×1 IMPLANT
EXTRACTOR VACUUM M CUP 4 TUBE (SUCTIONS) IMPLANT
EXTRACTOR VACUUM M CUP 4' TUBE (SUCTIONS)
GLOVE BIO SURGEON STRL SZ 6.5 (GLOVE) ×2 IMPLANT
GLOVE BIO SURGEONS STRL SZ 6.5 (GLOVE) ×1
GLOVE BIOGEL PI IND STRL 7.0 (GLOVE) ×1 IMPLANT
GLOVE BIOGEL PI INDICATOR 7.0 (GLOVE) ×2
GOWN STRL REUS W/TWL LRG LVL3 (GOWN DISPOSABLE) ×6 IMPLANT
KIT ABG SYR 3ML LUER SLIP (SYRINGE) IMPLANT
NEEDLE HYPO 25X5/8 SAFETYGLIDE (NEEDLE) IMPLANT
NS IRRIG 1000ML POUR BTL (IV SOLUTION) ×3 IMPLANT
PACK C SECTION WH (CUSTOM PROCEDURE TRAY) ×3 IMPLANT
PAD OB MATERNITY 4.3X12.25 (PERSONAL CARE ITEMS) ×3 IMPLANT
PENCIL SMOKE EVAC W/HOLSTER (ELECTROSURGICAL) ×3 IMPLANT
RTRCTR C-SECT PINK 25CM LRG (MISCELLANEOUS) ×3 IMPLANT
STRIP CLOSURE SKIN 1/2X4 (GAUZE/BANDAGES/DRESSINGS) ×2 IMPLANT
SUT MNCRL 0 VIOLET CTX 36 (SUTURE) ×2 IMPLANT
SUT MONOCRYL 0 CTX 36 (SUTURE) ×4
SUT PLAIN 1 NONE 54 (SUTURE) IMPLANT
SUT PLAIN 2 0 XLH (SUTURE) ×3 IMPLANT
SUT PROLENE 1 CT (SUTURE) ×3 IMPLANT
SUT VIC AB 0 CT1 27 (SUTURE) ×4
SUT VIC AB 0 CT1 27XBRD ANBCTR (SUTURE) ×2 IMPLANT
SUT VIC AB 2-0 CT1 27 (SUTURE) ×2
SUT VIC AB 2-0 CT1 TAPERPNT 27 (SUTURE) ×1 IMPLANT
SUT VIC AB 4-0 KS 27 (SUTURE) ×3 IMPLANT
SYR BULB IRRIGATION 50ML (SYRINGE) ×3 IMPLANT
TOWEL OR 17X24 6PK STRL BLUE (TOWEL DISPOSABLE) ×3 IMPLANT
TRAY FOLEY BAG SILVER LF 14FR (SET/KITS/TRAYS/PACK) ×3 IMPLANT

## 2017-06-16 NOTE — Progress Notes (Signed)
Nina Kent is a 26 y.o. G1P0 at [redacted]w[redacted]d admitted for [facility] - pressures increasing yesterday, started Mg last night.  BP much improved.    Subjective: +FM, no LOF, no VB, occ ctx.  No PIH sx's  Objective: BP 120/80 (BP Location: Left Arm)   Pulse 85   Temp 97.7 F (36.5 C) (Oral)   Resp 18   Ht  (1.702 m)   Wt 101 kg (222 lb 9 oz)   SpO2 95%   BMI 34.86 kg/m  I/O last 3 completed shifts: In: 2362.9 [P.O.:1680; I.V.:682.9] Out: 1370 [Urine:1370] No intake/output data recorded.  FHT:  FHR: 130-140 bpm, variability: moderate,  accelerations:  Present,  decelerations:   UC:   none  Labs: Lab Results  Component Value Date   WBC 8.9 06/16/2017   HGB 11.0 (L) 06/16/2017   HCT 31.3 (L) 06/16/2017   MCV 90.5 06/16/2017   PLT 154 06/16/2017    Assessment / Plan: PIH at 30+, s/p BMZ  Expectant mgmt - with sx's adjust HTN meds Mg for 24hr D/w Dr Clarisa Fling and pt POC  Nina Kent 06/16/2017, 7:52 AM

## 2017-06-16 NOTE — Anesthesia Preprocedure Evaluation (Signed)
Anesthesia Evaluation  Patient identified by MRN, date of birth, ID band Patient awake    Reviewed: Allergy & Precautions, NPO status , Patient's Chart, lab work & pertinent test results  Airway Mallampati: II  TM Distance: >3 FB Neck ROM: Full    Dental no notable dental hx.    Pulmonary shortness of breath,  Mild pulmonary edema on CXR   Pulmonary exam normal breath sounds clear to auscultation       Cardiovascular hypertension, Normal cardiovascular exam Rhythm:Regular Rate:Normal     Neuro/Psych negative neurological ROS  negative psych ROS   GI/Hepatic negative GI ROS, Neg liver ROS,   Endo/Other  negative endocrine ROS  Renal/GU negative Renal ROS  negative genitourinary   Musculoskeletal negative musculoskeletal ROS (+)   Abdominal   Peds negative pediatric ROS (+)  Hematology negative hematology ROS (+)   Anesthesia Other Findings   Reproductive/Obstetrics (+) Pregnancy Pre-eclamptic                             Anesthesia Physical Anesthesia Plan  ASA: II  Anesthesia Plan: Epidural   Post-op Pain Management:    Induction:   PONV Risk Score and Plan: 3 and Ondansetron and Treatment may vary due to age or medical condition  Airway Management Planned: Natural Airway  Additional Equipment:   Intra-op Plan:   Post-operative Plan:   Informed Consent: I have reviewed the patients History and Physical, chart, labs and discussed the procedure including the risks, benefits and alternatives for the proposed anesthesia with the patient or authorized representative who has indicated his/her understanding and acceptance.   Dental advisory given  Plan Discussed with:   Anesthesia Plan Comments:         Anesthesia Quick Evaluation

## 2017-06-16 NOTE — Transfer of Care (Signed)
Immediate Anesthesia Transfer of Care Note  Patient: Nina Kent  Procedure(s) Performed: Procedure(s): CESAREAN SECTION (N/A)  Patient Location: PACU  Anesthesia Type:Spinal  Level of Consciousness: awake, alert  and oriented  Airway & Oxygen Therapy: Patient Spontanous Breathing and Patient connected to nasal cannula oxygen  Post-op Assessment: Report given to RN and Post -op Vital signs reviewed and stable  Post vital signs: Reviewed and stable  Last Vitals:  Vitals:   06/16/17 1445 06/16/17 1450  BP:    Pulse:    Resp:    Temp:    SpO2: 98% 98%    Last Pain:  Vitals:   06/16/17 1219  TempSrc: Oral  PainSc:          Complications: No apparent anesthesia complications

## 2017-06-16 NOTE — Progress Notes (Signed)
Patient to operating room. ?

## 2017-06-16 NOTE — Progress Notes (Addendum)
Patient ID: Nina Kent, female   DOB: 1990-10-07, 26 y.o.   MRN: [id number]   Called with pt's SOB, decreased pulse ox  Pt on Mg - recc Stop Mg, stat level, CXR, cont EFM, toco  140's mod var, + accel No ctx  +FM, no LOF< no VB, no ctx.   CXR P Mg level P Pulse ox 94% w nasal canula  Mg level = 3.8 CXR - mild cardiomegaly, mild interstitial pulmonary edema   Will induce labor.  At last check baby vtx.    D/W MFM, D/W NICU d/w pt r/b/a of LTCS Will give  IV lasix CBC  Ancef for prophylaxis BS [place] - baby is breech.

## 2017-06-16 NOTE — Brief Op Note (Signed)
06/13/2017 - 06/16/2017  5:09 PM  PATIENT:  Nina Kent  26 y.o. female  PRE-OPERATIVE DIAGNOSIS:  breech, preeclampsia, pulmonary edema   POST-OPERATIVE DIAGNOSIS:  breech, preeclampsia, pulmonary edema   PROCEDURE:  Procedure(s): CESAREAN SECTION (N/A)   FINDINGS: viable female infant at 16:16, wt 3#9.1, apgars 7/9, nl PP uterus, tubes and ovaries  SURGEON:  Surgeon(s) and Role:    * Bovard-Stuckert, Jahmere Bramel, MD - Primary  ANESTHESIA:   spinal  EBL:  Total I/O In: 1822.5 [P.O.:300; I.V.:1522.5] Out: 1038 [Urine:660; Blood:378]  BLOOD ADMINISTERED:none  DRAINS: Urinary Catheter (Foley)   LOCAL MEDICATIONS USED:  NONE  SPECIMEN:  Source of Specimen:  Placenta  DISPOSITION OF SPECIMEN:  PATHOLOGY  COUNTS:  YES  TOURNIQUET:  * No tourniquets in log *  DICTATION: .Other Dictation: Dictation Number [id number]  PLAN OF CARE: Admit to inpatient   PATIENT DISPOSITION:  PACU - hemodynamically stable.   Delay start of Pharmacological VTE agent (>24hrs) due to surgical blood loss or risk of bleeding: not applicable

## 2017-06-16 NOTE — Anesthesia Postprocedure Evaluation (Addendum)
Anesthesia Post Note  Patient: Nina Kent  Procedure(s) Performed: Procedure(s) (LRB): CESAREAN SECTION (N/A)     Patient location during evaluation: PACU Anesthesia Type: Spinal Level of consciousness: awake and alert, patient cooperative and oriented Pain management: pain level controlled Vital Signs Assessment: post-procedure vital signs reviewed and stable Respiratory status: spontaneous breathing, nonlabored ventilation, respiratory function stable and patient connected to nasal cannula oxygen (CXR showed pulmonary edema, has diuresed with Lasix , no rales auscultated and resp unlabored) Cardiovascular status: stable (remains on HTN protocol) Postop Assessment: no apparent nausea or vomiting, no headache and patient able to bend at knees Anesthetic complications: no Comments: No stepdown or ICU bed available. Resp status is improving. Will have 1:4 RN to patient ratio, q1hr VS with pOx, and [place] O2.    Last Vitals:  Vitals:   06/16/17 1950 06/16/17 1958  BP:    Pulse: 88 88  Resp: (!) 26 (!) 26  Temp:    SpO2: 93% 92%    Last Pain:  Vitals:   06/16/17 1845  TempSrc:   PainSc: 0-No pain   Pain Goal:                 Ranvir Renovato,E. Elveta Rape

## 2017-06-17 ENCOUNTER — Encounter (HOSPITAL_COMMUNITY): Payer: Self-pay | Admitting: Obstetrics and Gynecology

## 2017-06-17 ENCOUNTER — Inpatient Hospital Stay (HOSPITAL_COMMUNITY): Payer: BC Managed Care – PPO

## 2017-06-17 DIAGNOSIS — Z98891 History of uterine scar from previous surgery: Secondary | ICD-10-CM

## 2017-06-17 DIAGNOSIS — J810 Acute pulmonary edema: Secondary | ICD-10-CM

## 2017-06-17 HISTORY — DX: History of uterine scar from previous surgery: Z98.891

## 2017-06-17 HISTORY — DX: Acute pulmonary edema: J81.0

## 2017-06-17 MED ORDER — FUROSEMIDE 10 MG/ML IJ SOLN
20.0000 mg | Freq: Once | INTRAMUSCULAR | Status: AC
  Administered 2017-06-17: 20 mg via INTRAVENOUS
  Filled 2017-06-17: qty 2

## 2017-06-17 NOTE — Progress Notes (Addendum)
Subjective: Postpartum Day 1: Cesarean Delivery Patient reports incisional pain and tolerating PO.  Had CXR - pulm edema, has received Lasix.  No Mg.  Feeling better now  Objective: Vital signs in last 24 hours: Temp:  [98.1 F (36.7 C)-99.4 F (37.4 C)] 98.1 F (36.7 C) (09/29 0825) Pulse Rate:  [64-104] 74 (09/29 0825) Resp:  [11-36] 19 (09/29 0825) BP: (114-162)/(80-118) 119/85 (09/29 0825) SpO2:  [87 %-98 %] 98 % (09/29 0825) Weight:  [102.1 kg (225 lb)] 102.1 kg (225 lb) (09/29 0615)  Physical Exam:  General: alert and no distress Lochia: appropriate Uterine Fundus: firm Incision: healing well DVT Evaluation: No evidence of DVT seen on physical exam. CV RRR Lung dec breath sounds B; R<L  Recent Labs  06/16/17 0300 06/16/17 1245  HGB 11.0* 11.9*  HCT 31.3* 33.7*    Assessment/Plan: Status post Cesarean section. Doing well postoperatively.  Continue current care. Baby NICU - CPAP Watch UOP closely, encourage po CXR    Nina Kent 06/17/2017, 9:29 AM

## 2017-06-17 NOTE — Progress Notes (Signed)
Late entry from approx 0945: Dr. Ellyn Hack on unit. Discussed diminished breath sounds bilaterally, but primarily in the Right Upper Lobe. Orders received and followed for 2view CXR. Resultings pending, to notify MD when results are finalized. Also discussed urine output, to notify MD if goes below 21ml/hr which is where we are at currently, pt encouraged to drink by this RN and Dr. Ellyn Hack.  Sheryn Bison

## 2017-06-17 NOTE — Addendum Note (Signed)
Addendum  created 06/17/17 1043 by Graciela Husbands, CRNA   Sign clinical note

## 2017-06-17 NOTE — Op Note (Signed)
Nina Kent, Nina Kent              ACCOUNT NO.:  [id number]  MEDICAL RECORD NO.:  [id number]  LOCATION:  9312                          FACILITY:  [facility]  PHYSICIAN:  Sherron Monday, MD        DATE OF BIRTH:  March 30, 1991  DATE OF PROCEDURE:  06/16/2017 DATE OF DISCHARGE:                              OPERATIVE REPORT   PREOPERATIVE DIAGNOSES:  Intrauterine pregnancy at 31 weeks, preeclampsia, breech presentation, pulmonary edema.  POSTOPERATIVE DIAGNOSES:  Intrauterine pregnancy at 31 weeks, preeclampsia, breech presentation, pulmonary edema, delivered.  PROCEDURE:  Primary low transverse cesarean section.  SURGEON:  Sherron Monday, MD.  ASSISTANT:  None.  COMPLICATIONS:  None.  PATHOLOGY:  Placenta to Pathology.  FINDINGS:  Viable female infant at 75 with a weight of 3 pounds 9.1 ounces, Apgars of 7 at one minute, 9 at five minutes.  Normal postpartum uterus, tubes, and ovaries.  ESTIMATED BLOOD LOSS:  Approximately 378 mL.  URINE OUTPUT:  660 mL.  IV FLUIDS:  1522.  DESCRIPTION OF PROCEDURE:  After informed consent was reviewed with the patient including risks, benefits, and alternatives of the surgical procedure, the ultrasound revealing a breech presentation for the infant, she was taken to the OR, transferred to the OR table, spinal anesthesia was placed and found to be adequate.  She was then returned to supine position with leftward tilt, prepped and draped in the normal sterile fashion.  A Foley catheter was sterilely drained.  A Pfannenstiel skin incision was made at the level of 2 fingerbreadths above the pubic symphysis, carried through the underlying layer of fascia sharply.  The fascia was incised in the midline.  The incision was extended with Mayo scissors.  Superior aspect of fascial incision was grasped with Kocher clamps, elevating the rectus muscles, were dissected off both bluntly and sharply.  Attention was then turned to the midline, which was  easily identified, and peritoneum was entered bluntly.  An Alexis skin retractor was placed carefully, making sure that no bowel was entrapped. The uterus was incised in a transverse fashion.  Infant was delivered from a frank breech presentation without complication.  Nose and mouth were suctioned on the field.  The cord was awaited a minute to be clamped.  Infant was handed off to the awaiting NICU staff.  The placenta was expressed from the uterus.  The uterus was cleared of all clot and debris.  The uterine incision was closed in 2 layers with 0 Vicryl, first of which was running locked and the second as an imbricating layer.  The gutters were cleared of all clot and debris. The peritoneum was reapproximated with the rectus muscles with 2-0 Vicryl in a running fashion.  Subfascial planes were inspected and found to be hemostatic.  Fascia was reapproximated with 0 Vicryl in a running fashion.  The corners were inspected and found to be intact. Subcuticular adipose layer was made hemostatic with Bovie cautery.  The dead space was easily closed with plain gut.  The skin was closed with 4- 0 Vicryl in a subcuticular fashion with a Mellody Dance needle.  Benzoin and Steri-Strips were applied.  The patient tolerated the procedure well. Sponge, lap, and needle  counts were correct x2 per the operating staff.     Sherron Monday, MD     JB/MEDQ  D:  06/16/2017  T:  06/16/2017  Job:  [id number]

## 2017-06-17 NOTE — Plan of Care (Signed)
Problem: Coping: Goal: Coping behaviors will improve Outcome: Progressing Pt is tolerating NICU admission appropriately and celebrating improvements in baby's status. Has great family support.  Problem: Fluid Volume: Goal: Peripheral tissue perfusion will improve Outcome: Progressing Pt has strong, pedal pulses and edema seems stable at this time.

## 2017-06-17 NOTE — Lactation Note (Signed)
This note was copied from a baby's chart. Lactation Consultation Note Mom w/Pre-eclampsia baby 31 weeks in NICU. 1st baby. Mom wishes to BF and provide BM for baby.  Mom has generalized edema. Breast feel heavy. Noted breast are pink. Mom is pale, where breast tissue start on chest, breast are pink, and warm. Denies pain. Nipples are flat. Edema noted to areola and nipple. Reverse pressure to evert nipple for a short period to short shaft. Hand expression taught. Hand expressed for several minutes before colostrum noted. Collected just enough to barely cover bottom of bullet.  DEBP set up. Mom shown how to use DEBP & how to disassemble, clean, & reassemble parts. Mom knows to pump q3h for 15-20 min.  Mom pumped 1.5 ml colostrum. Bullets given. Sticker from NICU given. Discussed black/blue dots. Labels given as well. Tech reviewed/demonstrated filling labels out.  Reviewed safe colostrum/milk storage. NICU Lactation booklet/information given. Made copies of pumping log to give mom.  Mom has DEBP Medela at home. Discussed how to make hands free bra for pumping. Encouraged mom to massage breast at intervals during pumping. Encouraged mom to hand express after pumping.  Answered a lot of questions mom had.  [facility] brochure given w/resources, support groups and LC services.  Patient Name: Nina Kent [id number] Date: 06/17/2017 Reason for consult: Initial assessment;Preterm <34wks   Maternal Data Has patient been taught Hand Expression?: Yes Does the patient have breastfeeding experience prior to this delivery?: No  Feeding    LATCH Score       Type of Nipple: Flat           Interventions Interventions: Breast compression;DEBP;Breast massage;Hand express;Expressed milk;Reverse pressure  Lactation Tools Discussed/Used Tools: Pump Breast pump type: Double-Electric Breast Pump Pump Review: Setup, frequency, and cleaning;Milk Storage Initiated by:: Peri Jefferson RN IBCLC Date  initiated:: 06/17/17   Consult Status Consult Status: Follow-up Date: 06/17/17 Follow-up type: In-patient    Charyl Dancer 06/17/2017, 2:37 AM

## 2017-06-17 NOTE — Progress Notes (Signed)
Pt returned from visiting baby in NICU for quite a while. When pt returned, pt had urine in foley bag, pt was sitting up pumping. Told her I would return. Spoke to Dr. Ellyn Hack by phone to notify her that CXR results were in, per request. She asked about urine output, told her I would return call with that number, as I was unsure exactly. NT had just emptied 400cc, MD notified. Output has improved. MD asked to reemphasize importance of IS. Pt states that she has done it hourly overnight and this morning. Pt educated on pneumonia prevention. Sheryn Bison

## 2017-06-17 NOTE — Anesthesia Postprocedure Evaluation (Signed)
Anesthesia Post Note  Patient: Rox Mcgriff Denker  Procedure(s) Performed: Procedure(s) (LRB): CESAREAN SECTION (N/A)     Patient location during evaluation: [facility] Anesthesia Type: Epidural Level of consciousness: awake and alert and oriented Pain management: satisfactory to patient Vital Signs Assessment: post-procedure vital signs reviewed and stable Respiratory status: respiratory function stable and spontaneous breathing Cardiovascular status: blood pressure returned to baseline Postop Assessment: no headache, no backache, spinal receding, patient able to bend at knees and adequate PO intake Anesthetic complications: no    Last Vitals:  Vitals:   06/17/17 0705 06/17/17 0825  BP:  119/85  Pulse:  74  Resp:  19  Temp:  36.7 C  SpO2: 98% 98%    Last Pain:  Vitals:   06/17/17 0917  TempSrc:   PainSc: 2    Pain Goal: Patients Stated Pain Goal: 3 (06/17/17 0905)               Karleen Dolphin

## 2017-06-18 ENCOUNTER — Encounter (HOSPITAL_COMMUNITY): Payer: Self-pay | Admitting: Obstetrics and Gynecology

## 2017-06-18 LAB — HEPATIC FUNCTION PANEL
ALT: 13 U/L — ABNORMAL LOW (ref 14–54)
AST: 20 U/L (ref 15–41)
Albumin: 2.6 g/dL — ABNORMAL LOW (ref 3.5–5.0)
Alkaline Phosphatase: 69 U/L (ref 38–126)
TOTAL PROTEIN: 5.5 g/dL — AB (ref 6.5–8.1)
Total Bilirubin: 0.2 mg/dL — ABNORMAL LOW (ref 0.3–1.2)

## 2017-06-18 MED ORDER — LABETALOL HCL 100 MG PO TABS: 300.0000 mg | ORAL_TABLET | Freq: Three times a day (TID) | ORAL | Status: DC

## 2017-06-18 MED ORDER — LABETALOL HCL 100 MG PO TABS
100.0000 mg | ORAL_TABLET | Freq: Once | ORAL | Status: AC
  Administered 2017-06-18: 100 mg via ORAL
  Filled 2017-06-18: qty 1

## 2017-06-18 MED ORDER — NIFEDIPINE ER OSMOTIC RELEASE 30 MG PO TB24
30.0000 mg | ORAL_TABLET | Freq: Two times a day (BID) | ORAL | Status: DC
  Administered 2017-06-18: 30 mg via ORAL
  Filled 2017-06-18: qty 1

## 2017-06-18 NOTE — Progress Notes (Signed)
Patient ID: Nina Kent, female   DOB: 25-Jan-1991, 25 y.o.   MRN: [id number]   Pt with elevated BP, no other sx's.  Will change labetalol to Procardia XL 30 bid

## 2017-06-18 NOTE — Plan of Care (Signed)
Problem: Physical Regulation: Goal: Ability to maintain clinical measurements within normal limits will improve Outcome: Progressing Pts lung sounds are much improved compared to my assessments yesterday 9/29. Still slightly diminished today, but right side particularly seems to be moving air much better than yesterday. Nursing will continue to monitor.

## 2017-06-18 NOTE — Progress Notes (Signed)
Notified Dr. Ellyn Hack by phone of pts continued elevated BP, but not qualifying for IV meds per HTN protocol at this time. Order received to give additional  of labetalol now to equal  dose for this afternoon. Will proceed with the  dose for tonight, like was previously ordered. Sheryn Bison

## 2017-06-18 NOTE — Lactation Note (Addendum)
This note was copied from a baby's chart. Lactation Consultation Note  Patient Name: Nina Kent [id number] Date: 06/18/2017 Reason for consult: Follow-up assessment;Preterm <34wks;Infant < 6lbs;NICU baby;Other (Comment) (pumping every 3 hours with EBM yield 1 st 3 times and then none )  Baby is 46 hours old and per mom  has been pumping every 3 hours around the clock and it is comfortable.  LC suggested increasing her hand expressing and also visiting the baby 1st in NICU and then coming back to pump.     Maternal Data    Feeding    LATCH Score                   Interventions Interventions: Breast feeding basics reviewed  Lactation Tools Discussed/Used Tools: Pump Breast pump type: Double-Electric Breast Pump Pump Review: Setup, frequency, and cleaning   Consult Status Consult Status: Follow-up Date: 06/19/17 Follow-up type: In-patient    Matilde Sprang [name] 06/18/2017, 2:21 PM

## 2017-06-18 NOTE — Plan of Care (Signed)
Problem: Health Behavior/Discharge Planning: Goal: Ability to manage health-related needs will improve Outcome: Progressing Planning to d/c foley this am and d/c IV fluids since pt is putting out adequate urine and is taking in fluids po.   Problem: Coping: Goal: Coping behaviors will improve Outcome: Progressing Pt is doing well with requesting rest time from family when needed. Very supportive and understanding family members.

## 2017-06-18 NOTE — Progress Notes (Signed)
Subjective: Postpartum Day 2: Cesarean Delivery Patient reports incisional pain, tolerating PO and no problems voiding.  On magnesium for seizure prophylaxis.  Restarted pump with decreased settings  Objective: Vital signs in last 24 hours: Temp:  [97.7 F (36.5 C)-99.5 F (37.5 C)] 99.5 F (37.5 C) (09/30 0800) Pulse Rate:  [70-82] 71 (09/30 0800) Resp:  [19-20] 20 (09/30 0800) BP: (126-156)/(87-99) 143/93 (09/30 0800) SpO2:  [92 %-100 %] 100 % (09/30 0800) Weight:  [102.1 kg (225 lb)] 102.1 kg (225 lb) (09/30 0600) Great uop  Physical Exam:  General: alert and no distress Lochia: appropriate Uterine Fundus: firm Incision: healing well DVT Evaluation: No evidence of DVT seen on physical exam.   Recent Labs  06/16/17 0300 06/16/17 1245  HGB 11.0* 11.9*  HCT 31.3* 33.7*    Assessment/Plan: Status post Cesarean section. Doing well postoperatively.  Continue current care.  Gailen Venne Bovard-Stuckert 06/18/2017, 10:02 AM

## 2017-06-19 LAB — RPR: RPR: NONREACTIVE

## 2017-06-19 LAB — HIV ANTIBODY (ROUTINE TESTING W REFLEX): HIV SCREEN 4TH GENERATION: NONREACTIVE

## 2017-06-19 LAB — RUBELLA SCREEN

## 2017-06-19 MED ORDER — OXYCODONE-ACETAMINOPHEN 5-325 MG PO TABS: 2.0000 | ORAL_TABLET | ORAL | Status: DC | PRN

## 2017-06-19 MED ORDER — NIFEDIPINE ER OSMOTIC RELEASE 30 MG PO TB24
60.0000 mg | ORAL_TABLET | Freq: Two times a day (BID) | ORAL | Status: DC
  Administered 2017-06-19 – 2017-06-20 (×2): 60 mg via ORAL
  Filled 2017-06-19 (×2): qty 2

## 2017-06-19 MED ORDER — NIFEDIPINE ER OSMOTIC RELEASE 30 MG PO TB24
60.0000 mg | ORAL_TABLET | Freq: Two times a day (BID) | ORAL | Status: DC
  Administered 2017-06-19: 60 mg via ORAL
  Filled 2017-06-19: qty 2

## 2017-06-19 MED ORDER — FUROSEMIDE 40 MG PO TABS
40.0000 mg | ORAL_TABLET | Freq: Once | ORAL | Status: AC
  Administered 2017-06-19: 40 mg via ORAL
  Filled 2017-06-19: qty 1

## 2017-06-19 MED ORDER — LABETALOL HCL 5 MG/ML IV SOLN
20.0000 mg | Freq: Once | INTRAVENOUS | Status: AC
  Administered 2017-06-19: 20 mg via INTRAVENOUS
  Filled 2017-06-19: qty 4

## 2017-06-19 MED ORDER — FUROSEMIDE 10 MG/ML IJ SOLN
20.0000 mg | Freq: Once | INTRAMUSCULAR | Status: AC
  Administered 2017-06-19: 20 mg via INTRAVENOUS
  Filled 2017-06-19: qty 2

## 2017-06-19 MED ORDER — OXYCODONE-ACETAMINOPHEN 5-325 MG PO TABS: 1.0000 | ORAL_TABLET | ORAL | Status: DC | PRN

## 2017-06-19 MED ORDER — IBUPROFEN 800 MG PO TABS: 800.0000 mg | ORAL_TABLET | Freq: Three times a day (TID) | ORAL | Status: DC | PRN

## 2017-06-19 NOTE — Clinical Social Work Maternal (Signed)
CLINICAL SOCIAL WORK MATERNAL/CHILD NOTE  Patient Details  Name: VEDANSHI MASSARO MRN: [id number] Date of Birth: 11/22/1990  Date:  06/19/2017  Clinical Social Worker Initiating Note:  Terri Piedra, LCSW Date/Time: Initiated:  06/19/17/1400     Child's Name:  Annice Pih   Biological Parents:  Mother, Father Steury and Monasia Lair)   Need for Interpreter:  None   Reason for Referral:  Parental Support of Premature Babies < 32 weeks/or Critically Ill babies (NICU admission at 39 weeks)   Address:  [place] [address]    Phone number:  [phone] (home)     Additional phone number:   Household Members/Support Persons (HM/SP):   Household Member/Support Person 1   HM/SP Name Relationship DOB or Age  HM/SP -1 Leticia Lepp FOB/husband    HM/SP -2        HM/SP -3        HM/SP -4        HM/SP -5        HM/SP -6        HM/SP -7        HM/SP -8          Natural Supports (not living in the home):  Friends, Social worker, Extended Family, Immediate Family (Parents report having an excellent support system of family and friends)   Medical illustrator Supports: None   Employment:     Type of Work: 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  FOB works for [employer]).   Education:      Homebound arranged:    Financial Resources:  Multimedia programmer   Other Resources:      Cultural/Religious Considerations Which May Impact Care: None stated.  Strengths:  Ability to meet basic needs , Compliance with medical plan , Home prepared for child , Understanding of illness, Pediatrician chosen   Psychotropic Medications:         Pediatrician:    [place]  Pediatrician List:   [facility]      Pediatrician Fax Number:    Risk Factors/Current Problems:  None   Cognitive State:  Able to  Concentrate , Insightful , Goal Oriented , Linear Thinking , Alert    Mood/Affect:  Comfortable , Calm , Euthymic , Interested    CSW Assessment: CSW met with parents in MOB's third floor room/312 to offer support, introduce services, and complete assessment due to baby's admission to NICU at [redacted] weeks gestation.  Parents were very pleasant and welcoming of CSW's visit.  They report they are doing well and that this is a good time to speak with them.  CSW found them both to be easy to engage. MOB reports that delivering prematurely was a shock and that they realized Friday that they were going to be having a baby that day.  She states that her MD was monitoring her BP because it had been elevated and she had been admitted to the hospital on the previous Tuesday.  She states by Friday, her BP was extremely high, she had protein in her urine and a PE.  She states "it all happened so fast" and both parents state they still feel like it is still setting in.  CSW validated this feeling and stated that it may take time for them to process  the events of the past week.  CSW informed them of ongoing support services offered by NICU CSW and asked them to call anytime.  They seemed appreciative.  They report feeling as though they are coping well at this point and acknowledge that they have been emotional at times, to which they feel is at an appropriate level to the situation.  CSW encouraged them to allow themselves to be emotional while monitoring their emotions throughout the postpartum time period.  CSW provided parents with a screening tool in order to self evaluate and help the conversation should they become concerned with their level of emotionality at any time.  Parents were attentive and receptive.  They deny any hx of mental health concerns. Parents appear to have a good understanding of baby's medical situation at this time and were interested in discussing basic milestones baby will have to meet in  order to be ready for discharge.  CSW spoke about this in general terms, not specific to their baby.  Parents stated understanding.  CSW informed parents of the availability to hold a family conference at any time and asked them to not be alarmed if CSW contacts them to schedule a family conference at some point.  Parents seemed very appreciative of the information and for CSW's concern for their emotional wellbeing.   Parents report that they have a great support system and most everything they need for baby at home.  They state they have a huge group of family and friends who live locally.  CSW informed them of [facility] as a way to keep everyone updated if they find themselves feeling overwhelmed with keeping family informed of baby's progress.  Parents state they still have things to organize but state they will have no problem having all needed items ready for baby by time of his discharge.  CSW encouraged parents to focus on their son and the progress he is making rather than his discharge day.  They appear calm and relaxed at this point.  MOB plans to be out of work until after Christmas break.  FOB states he will return to work once MOB is discharged and then take a week off of work when baby comes home.  They state no further questions, concerns or needs.  CSW provided them with CSW contact information and asked them to call anytime.    CSW Plan/Description:  Psychosocial Support and Ongoing Assessment of Needs, Sudden Infant Death Syndrome (SIDS) Education, Perinatal Mood and Anxiety Disorder (PMADs) Education    Kalman Shan 06/19/2017, 9:38 PM

## 2017-06-19 NOTE — Progress Notes (Signed)
Subjective: Postpartum Day 3  Cesarean Delivery Patient reports no problems voiding, she feels no further SOB and has diuresed 2600+ last shift with response to lasix.  No HA.  Working on pumping.  Baby doing well on RA in NICU.    Objective: Vital signs in last 24 hours: Temp:  [97.7 F (36.5 C)-98.9 F (37.2 C)] 98.1 F (36.7 C) (10/01 0847) Pulse Rate:  [81-96] 91 (10/01 0847) Resp:  [17-20] 18 (10/01 0847) BP: (142-161)/(79-108) 144/99 (10/01 0847) SpO2:  [94 %-98 %] 95 % (10/01 0847) Weight:  [98.9 kg (218 lb 0.8 oz)] 98.9 kg (218 lb 0.8 oz) (10/01 0510)  Physical Exam:  General: alert and cooperative Lochia: appropriate Uterine Fundus: firm Incision: C/D/I Extremities:  Decreased edema   Recent Labs  06/16/17 1245  HGB 11.9*  HCT 33.7*    Assessment/Plan: Status post Cesarean section.   Pt improving s/p LTCS/breech for severe preeclampsia.  BP still not optimally controlled, will increase procardia to 60XL BID.  Will give one more po dose of lasix this AM as well given propensity to pulmonary edema as fluid is mobilizing.   Pt hoping for d/c today, told her we will reassess this PM on increased BP meds but that needs to be controlled before d/c.    Oliver Pila 06/19/2017, 9:05 AM

## 2017-06-19 NOTE — Progress Notes (Signed)
Pt received Lasix 20 mg IV at 0316. She has voided twice since with volumes of 800 ml and 1000 ml. She is now resting in bed and states, "I feel a lot better now."

## 2017-06-19 NOTE — Progress Notes (Signed)
   06/18/17 1950  Vital Signs  BP (!) 156/97  BP Location Left Arm  Patient Position (if appropriate) Semi-fowlers  BP Method Automatic  Pulse Rate 81  Pulse Rate Source Monitor  Oxygen Therapy  SpO2 98 %  O2 Device Room Air  Reevaluation of BP. Pt denies HA, blurred vision, dizziness,  nausea, and pain. Will continue to monitor.

## 2017-06-19 NOTE — Progress Notes (Signed)
   06/19/17 0245  Vital Signs  BP (!) 150/98  BP Location Left Arm  Patient Position (if appropriate) Sitting  BP Method Automatic  Pulse Rate 93  Pulse Rate Source Monitor  Resp 18  Complaints & Interventions  Complains of Insomnia;Other (Comment) (indigestion)  Interventions Medication (see MAR)  Oxygen Therapy  SpO2 96 %  O2 Device Room Air  Pt c/o pain in mid chest. "Like I felt when my lungs got full." BP and pulse ox reading taken. Tums 2 tabs po given.

## 2017-06-19 NOTE — Progress Notes (Signed)
Patient ID: Nina Kent, female   DOB: 1991-09-10, 26 y.o.   MRN: [id number] Pt received her Procardia XL late today at 1100am BP 124-170/93-115 so not well-controlled enough for d/c home this PM No further SOB, feels well  Procardia XL increased to  this AM but did not receive first dose until later in morning per RN  BP improving this afternoon on that med, will change dosing times to 8am and 8pm.  If does not control BP fully, will consider dual therapy with labetalol.   Baby stable in NICU. On RA, tube feedings advancing

## 2017-06-19 NOTE — Progress Notes (Signed)
   06/18/17 2240  Vital Signs  BP (!) 146/94  BP Location Left Arm  Patient Position (if appropriate) Semi-fowlers  BP Method Automatic  Pulse Rate 96  Pulse Rate Source Monitor  Resp 20  Temp 98.2 F (36.8 C)  Temp Source Oral  Oxygen Therapy  SpO2 95 %  O2 Device Room Air  Incentive Spirometry  Incentive Spirometry - Achieved (mL) (RN, NT, or RT) 1250 mL  Incentive Spirometry Goal (mL) (RN or RT) 1250 mL  BP improving, Pt resting in bed w/o complaints.

## 2017-06-19 NOTE — Lactation Note (Signed)
This note was copied from a baby's chart. Lactation Consultation Note  Patient Name: Nina Kent ZOXWR'U Date: 06/19/2017 Reason for consult: Follow-up assessment;NICU baby;Other (Comment) (milk coming in and consistently hand expressing and pumping )  Baby is 61 hours old As LC entered the room mom was just finishing pumping and and hand expressing 10 ml.  LC praised mom for her efforts of consistence of both.  Per mom had to have Lasix this am for excessive edema x1 and RN confirmed.  LC instructed mom on the use her own Medela DEBP for home.  Also instructed mom on the importance of pumping when she comes to visit baby in NICU.     Maternal Data Has patient been taught Hand Expression?: Yes  Feeding Feeding Type: Donor Breast Milk Length of feed: 30 min  LATCH Score                   Interventions Interventions: Breast feeding basics reviewed  Lactation Tools Discussed/Used Tools: Pump Breast pump type: Double-Electric Breast Pump   Consult Status Consult Status: Follow-up Date: 06/20/17 Follow-up type: In-patient    Nina Kent 06/19/2017, 3:41 PM

## 2017-06-19 NOTE — Progress Notes (Signed)
Spoke with Dr. Ellyn Hack, Pt c/o discomfort in her mid chest. Lasix 20 mg IV ordered.

## 2017-06-19 NOTE — Progress Notes (Signed)
   06/18/17 1945  Vital Signs  BP (!) 160/103  BP Location Left Arm  Patient Position (if appropriate) Sitting  BP Method Automatic  Pulse Rate 87  Pulse Rate Source Monitor  Temp 97.9 F (36.6 C)  Temp Source Oral  Oxygen Therapy  SpO2 98 %  O2 Device Room Air  MD on unit. Notified of BP. New orders received. Pt aware of change to plan of care.

## 2017-06-19 NOTE — Plan of Care (Signed)
Problem: Fluid Volume: Goal: Ability to maintain a balanced intake and output will improve Outcome: Progressing Patient c/o pain in her mid chest "Like when my lungs got full." Given Lasix 20 mg IV and voided off 1,800 over two hours. Verbalizes, "I feel a lot better."

## 2017-06-20 ENCOUNTER — Encounter (HOSPITAL_COMMUNITY): Payer: Self-pay | Admitting: Obstetrics and Gynecology

## 2017-06-20 MED ORDER — IBUPROFEN 800 MG PO TABS: 800.0000 mg | ORAL_TABLET | Freq: Three times a day (TID) | ORAL | 1 refills | Status: DC | PRN

## 2017-06-20 MED ORDER — NIFEDIPINE ER 60 MG PO TB24: 60.0000 mg | ORAL_TABLET | Freq: Two times a day (BID) | ORAL | 3 refills | Status: DC

## 2017-06-20 MED ORDER — OXYCODONE HCL 5 MG PO TABS: ORAL_TABLET | ORAL | 0 refills | Status: DC

## 2017-06-20 MED ORDER — PRENATAL MULTIVITAMIN CH: 1.0000 | ORAL_TABLET | Freq: Every day | ORAL | 3 refills | Status: AC

## 2017-06-20 MED ORDER — LABETALOL HCL 100 MG PO TABS: 100.0000 mg | ORAL_TABLET | Freq: Two times a day (BID) | ORAL | 3 refills | Status: DC

## 2017-06-20 MED ORDER — LABETALOL HCL 100 MG PO TABS
100.0000 mg | ORAL_TABLET | Freq: Two times a day (BID) | ORAL | Status: DC
  Administered 2017-06-20: 100 mg via ORAL
  Filled 2017-06-20: qty 1

## 2017-06-20 NOTE — Plan of Care (Signed)
Problem: Coping: Goal: Ability to verbalize feelings will improve Outcome: Progressing Patient presents an accepting attitude and verbalizes willingness to work with staff "To get better." She verbalizes, "I'm using all my leave from my job to stay with my baby,now rather than go back and take leave again when he comes home. I want to be here for him."  Problem: Physical Regulation: Goal: Complications related to the disease process, condition or treatment will be avoided or minimized Outcome: Progressing Patient encouraged to continue to let staff know when she has difficulty with here breathing, or dizziness, HA, nausea, or epigastric pain as these are signs of worsening condition and hypertension.

## 2017-06-20 NOTE — Progress Notes (Signed)
Subjective: Postpartum Day 4: Cesarean Delivery Patient reports incisional pain, tolerating PO and no problems voiding.    Objective: Vital signs in last 24 hours: Temp:  [97.9 F (36.6 C)-98.4 F (36.9 C)] 98.4 F (36.9 C) (10/02 0405) Pulse Rate:  [78-100] 100 (10/02 0405) Resp:  [18] 18 (10/02 0405) BP: (115-170)/(89-116) 115/96 (10/02 0405) SpO2:  [95 %-98 %] 97 % (10/02 0405) Weight:  [94.1 kg (207 lb 6.4 oz)] 94.1 kg (207 lb 6.4 oz) (10/02 0449)  Physical Exam:  General: alert and no distress Lochia: appropriate Uterine Fundus: firm Incision: healing well DVT Evaluation: No evidence of DVT seen on physical exam.  No results for input(s): HGB, HCT in the last 72 hours.  Assessment/Plan: Status post Cesarean section. Doing well postoperatively.  Continue current care.  Some struggles w BP, now on nifedipine XL 60bid  And labetalol 100 bid Likely d/c this PM  Nina Kent 06/20/2017, 8:15 AM

## 2017-06-20 NOTE — Progress Notes (Addendum)
2120 Patient has returned to unit from NICU. Medication given as ordered.

## 2017-06-20 NOTE — Discharge Summary (Signed)
OB Discharge Summary     Patient Name: Nina Kent DOB: 1991/07/30 MRN: [id number]  Date of admission: 06/13/2017 Delivering MD: Sherian Rein   Date of discharge: 06/20/2017  Admitting diagnosis: 31wks Direct admit Intrauterine pregnancy: [redacted]w[redacted]d     Secondary diagnosis:  Principal Problem:   S/P primary low transverse C-section Active Problems:   Preeclampsia   Pulmonary edema, acute (HCC)  Additional problems: BP control     Discharge diagnosis: Preterm Pregnancy Delivered                                                                                                Post partum procedures:diuresis with Lasix  Augmentation: N/A  Complications: None  Hospital course:  Sceduled C/S   26 y.o. yo G1P0100 at [redacted]w[redacted]d was admitted to the hospital 06/13/2017 for scheduled cesarean section with the following indication:Malpresentation. Pulmonary edema, preeclampsia Membrane Rupture Time/Date: 4:15 PM ,06/16/2017   Patient delivered a Viable infant.06/16/2017  Details of operation can be found in separate operative note.  Pateint had an uncomplicated postpartum course.  She is ambulating, tolerating a regular diet, passing flatus, and urinating well. Patient is discharged home in stable condition on  06/20/17         Physical exam  Vitals:   06/20/17 0800 06/20/17 1340 06/20/17 1430 06/20/17 1631  BP: (!) 142/96 (!) 133/91 (!) 140/95 128/89  Pulse: (!) 105 91 (!) 110 98  Resp: Temp: 97.8 F (36.6 C) 98.1 F (36.7 C)  98.5 F (36.9 C)  TempSrc: Oral Oral  Oral  SpO2: 95%   97%  Weight:      Height:       General: alert and no distress Lochia: appropriate Uterine Fundus: firm Incision: Healing well with no significant drainage DVT Evaluation: No evidence of DVT seen on physical exam. Labs: Lab Results  Component Value Date   WBC 12.5 (H) 06/16/2017   HGB 11.9 (L) 06/16/2017   HCT 33.7 (L) 06/16/2017   MCV 91.8 06/16/2017   PLT 152 06/16/2017    CMP Latest Ref Rng & Units 06/16/2017  Glucose 65 - 99 mg/dL -  BUN 6 - 20 mg/dL -  Creatinine 4.09 - 8.11 mg/dL 9.14  Sodium 782 - 956 mmol/L -  Potassium 3.5 - 5.1 mmol/L -  Chloride 101 - 111 mmol/L -  CO2 22 - 32 mmol/L -  Calcium 8.9 - 10.3 mg/dL -  Total Protein 6.5 - 8.1 g/dL 2.1(H)  Total Bilirubin 0.3 - 1.2 mg/dL 0.8(M)  Alkaline Phos 38 - 126 U/L 69  AST 15 - 41 U/L 20  ALT 14 - 54 U/L 13(L)    Discharge instruction: per After Visit Summary and "Baby and Me Booklet".  After visit meds:  Allergies as of 06/20/2017   No Known Allergies     Medication List    STOP taking these medications   BONJESTA 20-20 MG Tbcr Generic drug:  Doxylamine-Pyridoxine ER     TAKE these medications   acetaminophen 325 MG tablet Commonly known as:  TYLENOL Take 650 mg by  mouth every 6 (six) hours as needed for moderate pain.   ibuprofen 800 MG tablet Commonly known as:  ADVIL,MOTRIN Take 1 tablet (800 mg total) by mouth every 8 (eight) hours as needed for moderate pain.   labetalol 100 MG tablet Commonly known as:  NORMODYNE Take 1 tablet (100 mg total) by mouth 2 (two) times daily. What changed:  when to take this   NIFEdipine 60 MG 24 hr tablet Commonly known as:  PROCARDIA-XL/ADALAT CC Take 1 tablet (60 mg total) by mouth 2 (two) times daily.   oxyCODONE 5 MG immediate release tablet Commonly known as:  ROXICODONE 1-2 po q 6 prn severe pain   prenatal multivitamin Tabs tablet Take 1 tablet by mouth daily at 12 noon.       Diet: routine diet  Activity: Advance as tolerated. Pelvic rest for 6 weeks.   Outpatient follow up:1,2 and 6 weeks Follow up Appt:No future appointments. Follow up Visit:No Follow-up on file.  Postpartum contraception: Undecided  Newborn Data: Live born female  Birth Weight: 3 lb 9.1 oz (1620 g) APGAR: 7, 9  Newborn Delivery   Birth date/time:  06/16/2017 16:16:00 Delivery type:  C-Section, Low Transverse  C-section categorization:   Primary     Baby Feeding: Breast Disposition:NICU   06/20/2017 Sherian Rein, MD

## 2017-06-20 NOTE — Progress Notes (Signed)
Patient ID: Nina Kent, female   DOB: [birth year], 26 y.o.   MRN: [id number]   BP stable on procardia and labetalol Will d/c

## 2017-06-20 NOTE — Progress Notes (Signed)
Pt noted to be walking off unit to visit NICU. Face flushed, no resp distress noted. Staff requested pt use wheelchair as she had family member with her. She agreed.

## 2017-06-20 NOTE — Lactation Note (Signed)
This note was copied from a baby's chart. Lactation Consultation Note  Patient Name: Nina Kent WUJWJ'X Date: 06/20/2017 Reason for consult: Follow-up assessment;Infant < 6lbs;NICU baby;Preterm <34wks;Other (Comment) (pumping consistently/ milk in in )  Per mom milk is in both breast and last pumping was 2 1/2-3 oz  Per mom breast are comfortable.  Sore nipples and engorgement prevention tx reviewed.  Mom has  DEBP - Medela . LC had reviewed with mom yesterday.  Both mom and dad receptive to teaching.  LC stressed the importance of of consistent pumping at least 8 x's a day,  Since the milk is in may be more. Think prevention of engorgement.  Reviewed storage of breast milk according to NICU .  Mother informed of post-discharge support and given phone number to the lactation department, including services for phone call assistance; out-patient appointments; and breastfeeding support group. List of other breastfeeding resources in the community given in the handout. Encouraged mother to call for problems or concerns related to breastfeeding.   Maternal Data Has patient been taught Hand Expression?: Yes  Feeding Feeding Type: Donor Breast Milk Length of feed: 30 min  LATCH Score                   Interventions Interventions: Breast feeding basics reviewed  Lactation Tools Discussed/Used Breast pump type: Double-Electric Breast Pump WIC Program: No Pump Review: Milk Storage   Consult Status Consult Status: PRN Follow-up type: Other (comment) (baby in NICU )    Kathrin Greathouse 06/20/2017, 10:17 AM

## 2017-06-20 NOTE — Addendum Note (Signed)
Addendum  created 06/20/17 1917 by Jairo Ben, MD   Sign clinical note, SmartForm saved

## 2017-07-05 ENCOUNTER — Ambulatory Visit: Payer: Self-pay

## 2017-07-05 NOTE — Lactation Note (Signed)
This note was copied from a baby's chart. Lactation Consultation Note  Patient Name: Nina Kent WGNFA'OToday's Date: 07/05/2017 Reason for consult: Follow-up assessment;NICU baby  Baby 142 weeks old. Called to assist with first latch as baby has been cueing to nurse. Mom reports that she is collecting close to 4 ounces each time that she pumps. Discussed the benefits of pumping prior to latching baby. Baby quiet alert at breast and able to lick, and mouth right nipple in football position. Could not elicit a suckle with nipple or this LC's gloved finger. Demonstrated to mom how to hand express and dribble milk in baby's mouth. Also discussed positioning and how to hold breast and baby's head and other elements of a deep latch. Discussed with mom that this was a great first attempt. Enc mom to keep putting baby to breast to nuzzle/latch as she and baby able.   Maternal Data    Feeding Feeding Type: Breast Fed Length of feed:  (LC assessed 20 minutes of attempt.)  LATCH Score Latch: Too sleepy or reluctant, no latch achieved, no sucking elicited.  Audible Swallowing: None  Type of Nipple: Everted at rest and after stimulation (short shaft)  Comfort (Breast/Nipple): Soft / non-tender  Hold (Positioning): Assistance needed to correctly position infant at breast and maintain latch.  LATCH Score: 5  Interventions Interventions: Breast feeding basics reviewed;Assisted with latch;Hand express;Pre-pump if needed;Breast compression;Adjust position;Support pillows;Position options;Expressed milk  Lactation Tools Discussed/Used     Consult Status Consult Status: PRN    Sherlyn HayJennifer D Mickie Kozikowski 07/05/2017, 11:24 AM

## 2018-10-14 IMAGING — DX DG CHEST 1V PORT
1 series · 1 of 1 positions shown · non-contrast
Comparison: Earlier the same date.

CLINICAL DATA: Shortness of breath post delivery.  Pre-eclampsia.

EXAM:
PORTABLE CHEST 1 VIEW

[chest ap]
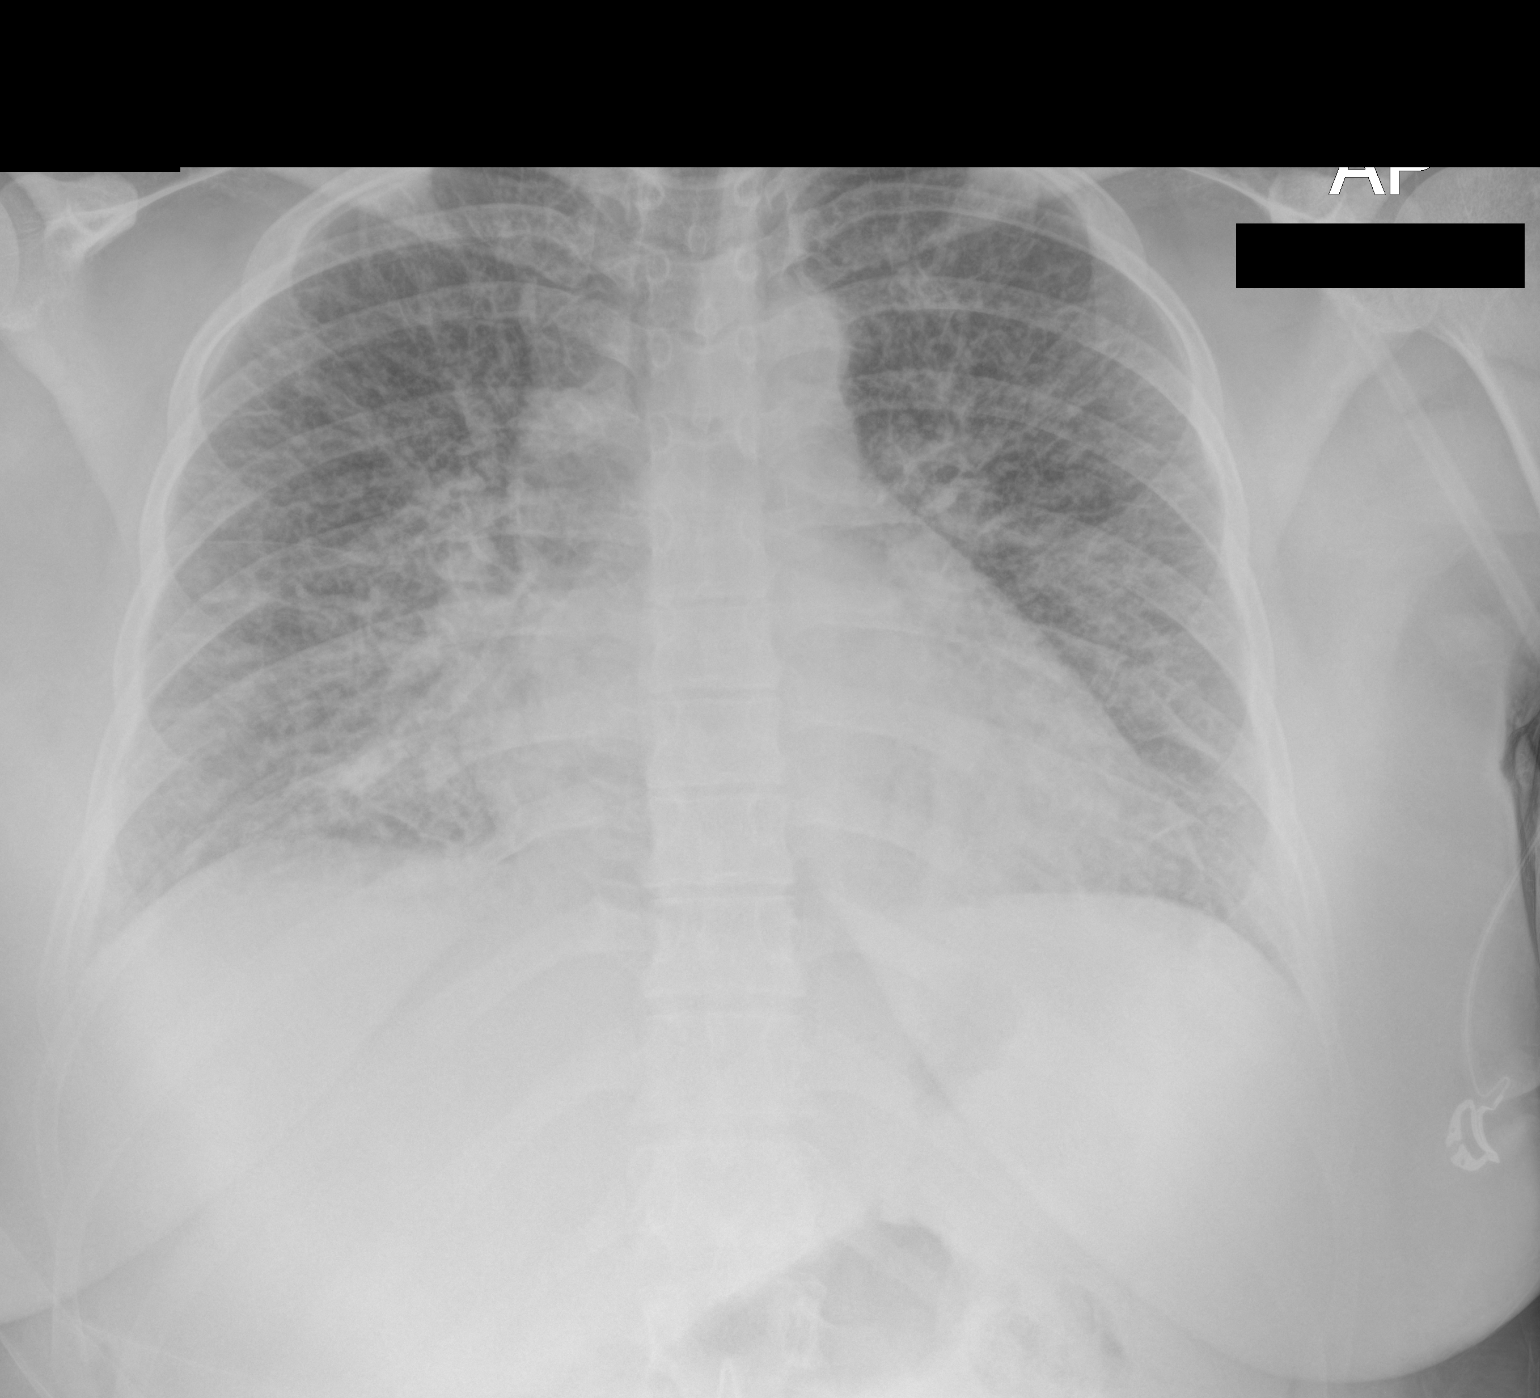

[1 of 1 positions shown; findings below may reference images not displayed]

FINDINGS: 8935 hours. The heart size is stable, at the upper limits of normal
for portable AP technique. Generalized interstitial prominence has
mildly progressed, again most consistent with pulmonary edema.
Asymmetric right basilar component is attributed to atelectasis.
There is no consolidation, significant pleural effusion or
pneumothorax. The bones appear unchanged.
IMPRESSION: Slight worsening of pulmonary edema.

## 2019-08-21 LAB — OB RESULTS CONSOLE GC/CHLAMYDIA
Chlamydia: NEGATIVE
Gonorrhea: NEGATIVE

## 2019-08-21 LAB — OB RESULTS CONSOLE ABO/RH: RH Type: POSITIVE

## 2019-08-21 LAB — OB RESULTS CONSOLE RUBELLA ANTIBODY, IGM: Rubella: IMMUNE

## 2019-08-21 LAB — OB RESULTS CONSOLE ANTIBODY SCREEN: Antibody Screen: NEGATIVE

## 2019-08-21 LAB — OB RESULTS CONSOLE HIV ANTIBODY (ROUTINE TESTING): HIV: NONREACTIVE

## 2019-08-21 LAB — OB RESULTS CONSOLE HEPATITIS B SURFACE ANTIGEN: Hepatitis B Surface Ag: NEGATIVE

## 2019-08-21 LAB — OB RESULTS CONSOLE RPR: RPR: NONREACTIVE

## 2020-03-05 LAB — OB RESULTS CONSOLE GBS: GBS: NEGATIVE

## 2020-03-16 ENCOUNTER — Encounter (HOSPITAL_COMMUNITY): Payer: Self-pay | Admitting: *Deleted

## 2020-03-16 NOTE — Patient Instructions (Signed)
MEILING HENDRIKS  03/16/2020   Your procedure is scheduled on:  03/24/2020  Arrive at 0800 at Entrance C on [address] at [facility]  and [employer]. You are invited to use the FREE valet parking or use the Visitor's parking deck.  Pick up the phone at the desk and dial [phone].  Call this number if you have problems the morning of surgery: [phone]  Remember:   Do not eat food:(After Midnight) Desps de medianoche.  Do not drink clear liquids: (After Midnight) Desps de medianoche.  Take these medicines the morning of surgery with A SIP OF WATER:  none   Do not wear jewelry, make-up or nail polish.  Do not wear lotions, powders, or perfumes. Do not wear deodorant.  Do not shave 48 hours prior to surgery.  Do not bring valuables to the hospital.  [facility] is not   responsible for any belongings or valuables brought to the hospital.  Contacts, dentures or bridgework may not be worn into surgery.  Leave suitcase in the car. After surgery it may be brought to your room.  For patients admitted to the hospital, checkout time is 11:00 AM the day of              discharge.      Please read over the following fact sheets that you were given:     Preparing for Surgery

## 2020-03-22 ENCOUNTER — Other Ambulatory Visit (HOSPITAL_COMMUNITY)
Admission: RE | Admit: 2020-03-22 | Discharge: 2020-03-22 | Disposition: A | Discharge status: Home / Self Care | Payer: BC Managed Care – PPO | Source: Ambulatory Visit | Attending: Obstetrics and Gynecology | Admitting: Obstetrics and Gynecology

## 2020-03-22 ENCOUNTER — Other Ambulatory Visit: Payer: Self-pay

## 2020-03-22 DIAGNOSIS — Z01812 Encounter for preprocedural laboratory examination: Secondary | ICD-10-CM | POA: Insufficient documentation

## 2020-03-22 DIAGNOSIS — Z20822 Contact with and (suspected) exposure to covid-19: Secondary | ICD-10-CM | POA: Insufficient documentation

## 2020-03-22 HISTORY — DX: Gestational (pregnancy-induced) hypertension without significant proteinuria, unspecified trimester: O13.9

## 2020-03-22 LAB — CBC
HCT: 34.6 % — ABNORMAL LOW (ref 36.0–46.0)
Hemoglobin: 11.7 g/dL — ABNORMAL LOW (ref 12.0–15.0)
MCH: 30.3 pg (ref 26.0–34.0)
MCHC: 33.8 g/dL (ref 30.0–36.0)
MCV: 89.6 fL (ref 80.0–100.0)
Platelets: 230 10*3/uL (ref 150–400)
RBC: 3.86 MIL/uL — ABNORMAL LOW (ref 3.87–5.11)
RDW: 12.7 % (ref 11.5–15.5)
WBC: 8.4 10*3/uL (ref 4.0–10.5)
nRBC: 0 % (ref 0.0–0.2)

## 2020-03-22 LAB — SARS CORONAVIRUS 2 (TAT 6-24 HRS): SARS Coronavirus 2: NEGATIVE

## 2020-03-22 LAB — TYPE AND SCREEN
ABO/RH(D): O POS
Antibody Screen: NEGATIVE

## 2020-03-22 LAB — ABO/RH: ABO/RH(D): O POS

## 2020-03-22 LAB — RPR: RPR Ser Ql: NONREACTIVE

## 2020-03-22 NOTE — MAU Note (Signed)
Pt here for covid swab and lab draw. Denies symptoms or sick contacts. Swab collected.  

## 2020-03-23 NOTE — H&P (Signed)
Nina Kent is a 29 y.o. female G2P0101 at 39+ for rLTCS.  Pregnancy dated by early [place].  Pt with h/o LTCS at 31 weeks due to severe preeclampsia, breech presentation and pulmonary edema.  This pregnancy normotensive - has been taking baby ASA.  Pregnancy complicated by maternal obesity and rubella nonimmune.  Had normal, low-risk panorama - female infant.  EDC 7/9.  Have d/w pt r/b/a of rLTCS - will proceed.    OB History    Gravida  2   Para  1   Term      Preterm  1   AB      Living        SAB      TAB      Ectopic      Multiple  0   Live Births            G1 31 wk LTCS, breech, pulmonary edema; "Nina Kent" 9/18 3# G2 present  No abn pap, no STD  Past Medical History:  Diagnosis Date   Pregnancy induced hypertension    Pulmonary edema, acute (HCC) 06/17/2017   S/P primary low transverse C-section 06/17/2017  some HA, improved w pregnancy  Past Surgical History:  Procedure Laterality Date   CESAREAN SECTION N/A 06/16/2017   Procedure: CESAREAN SECTION;  Surgeon: Sherian Rein, MD;  Location: [facility];  Service: Obstetrics;  Laterality: N/A;   thumb surgery     WISDOM TOOTH EXTRACTION     Family History: family history includes Cancer in her father and paternal grandmother; Diabetes in her maternal grandmother; Hypertension in her maternal aunt, maternal grandfather, and mother. Social History:  reports that she has never smoked. She has never used smokeless tobacco. She reports that she does not drink alcohol and does not use drugs. married, teacher  Meds PNV, baby ASA All NKDA     Maternal Diabetes: No Genetic Screening: Normal Maternal Ultrasounds/Referrals: Normal Fetal Ultrasounds or other Referrals:  None Maternal Substance Abuse:  No Significant Maternal Medications:  None Significant Maternal Lab Results:  Group B Strep negative Other Comments:  h/o 31 wk delivery w severe Pre E 9/18  Review of Systems  Constitutional:  Negative.   HENT: Negative.   Eyes: Negative.   Respiratory: Negative.   Cardiovascular: Negative.   Gastrointestinal: Negative.   Genitourinary: Negative.   Musculoskeletal: Positive for back pain.  Skin: Negative.   Neurological: Negative.   Psychiatric/Behavioral: Negative.    Maternal Medical History:  Contractions: Frequency: irregular.    Fetal activity: Perceived fetal activity is normal.    Prenatal complications: H/o severe PreE, preterm delivery  Prenatal Complications - Diabetes: none.      unknown if currently breastfeeding. Maternal Exam:  Uterine Assessment: Contraction strength is mild.  Contraction frequency is irregular.   Abdomen: Patient reports no abdominal tenderness. Surgical scars: low transverse.   Fundal height is apprpriate for gestation.   Estimated fetal weight is 7.5-8#.   Fetal presentation: vertex  Introitus: Normal vulva. Normal vagina.    Physical Exam Constitutional:      Appearance: Normal appearance.  HENT:     Head: Normocephalic and atraumatic.  Cardiovascular:     Rate and Rhythm: Normal rate and regular rhythm.  Pulmonary:     Effort: Pulmonary effort is normal.     Breath sounds: Normal breath sounds.  Abdominal:     General: Bowel sounds are normal.     Palpations: Abdomen is soft.     Comments:  GRAVID  Genitourinary:    General: Normal vulva.  Musculoskeletal:        General: Normal range of motion.  Skin:    General: Skin is warm and dry.  Neurological:     General: No focal deficit present.     Mental Status: She is alert and oriented to person, place, and time.  Psychiatric:        Mood and Affect: Mood normal.        Behavior: Behavior normal.     Prenatal labs: ABO, Rh: --/--/O POS, O POS Performed at [facility], [address], [place], [place] [address]  [phone]) Antibody: NEG (07/04 0850) Rubella: Immune (12/02 0000) RPR: NON REACTIVE (07/04 0850)  HBsAg: Negative (12/02 0000)  HIV:  Non-reactive (12/02 0000)  GBS: Negative/-- (06/17 0000)  Tdap 01/01/20/Hgb 14.0/Plt 245/Ur Cx neg/GC neg/Chl neg/Varicella immune/Rubella nonimmune/Hgb electro WNL/HCV neg/ Dated by early [place] glucola 135 - nl 3 hr/ [place] nl limited anat, post plac, female F/u US completes nl anatomy, good growth  Panorama low risk, female  Assessment/Plan: 28yo G2P0101 at 39+ for rLTCS H/o PreE, pulmonary edema - close monitoring Have d/w pt r/b/a of LTCS and process - will proceed Ancef for prophylaxis RNI - vaccine PP Received T[facility]ap in [facility]  Nina Kent 03/23/2020, 8:48 AM

## 2020-03-24 ENCOUNTER — Encounter (HOSPITAL_COMMUNITY)
Admission: RE | Disposition: A | Discharge status: Home / Self Care | Payer: Self-pay | Source: Home / Self Care | Attending: Obstetrics and Gynecology

## 2020-03-24 ENCOUNTER — Inpatient Hospital Stay (HOSPITAL_COMMUNITY): Payer: BC Managed Care – PPO

## 2020-03-24 ENCOUNTER — Encounter (HOSPITAL_COMMUNITY): Payer: Self-pay | Admitting: Obstetrics and Gynecology

## 2020-03-24 ENCOUNTER — Other Ambulatory Visit: Payer: Self-pay

## 2020-03-24 ENCOUNTER — Inpatient Hospital Stay (HOSPITAL_COMMUNITY)
Admission: RE | Admit: 2020-03-24 | Discharge: 2020-03-26 | DRG: 788 | Disposition: A | Discharge status: Home / Self Care | Payer: BC Managed Care – PPO | Attending: Obstetrics and Gynecology | Admitting: Obstetrics and Gynecology

## 2020-03-24 DIAGNOSIS — Z3A39 39 weeks gestation of pregnancy: Secondary | ICD-10-CM | POA: Diagnosis not present

## 2020-03-24 DIAGNOSIS — E669 Obesity, unspecified: Secondary | ICD-10-CM | POA: Diagnosis present

## 2020-03-24 DIAGNOSIS — Z20822 Contact with and (suspected) exposure to covid-19: Secondary | ICD-10-CM | POA: Diagnosis present

## 2020-03-24 DIAGNOSIS — O34211 Maternal care for low transverse scar from previous cesarean delivery: Secondary | ICD-10-CM | POA: Diagnosis present

## 2020-03-24 DIAGNOSIS — O99214 Obesity complicating childbirth: Secondary | ICD-10-CM | POA: Diagnosis present

## 2020-03-24 DIAGNOSIS — Z98891 History of uterine scar from previous surgery: Secondary | ICD-10-CM

## 2020-03-24 SURGERY — Surgical Case
Anesthesia: Spinal

## 2020-03-24 MED ORDER — KETOROLAC TROMETHAMINE 30 MG/ML IJ SOLN: 30.0000 mg | Freq: Four times a day (QID) | INTRAMUSCULAR | Status: AC | PRN

## 2020-03-24 MED ORDER — SENNOSIDES-DOCUSATE SODIUM 8.6-50 MG PO TABS
2.0000 | ORAL_TABLET | ORAL | Status: DC
  Administered 2020-03-24 – 2020-03-26 (×2): 2 via ORAL
  Filled 2020-03-24 (×2): qty 2

## 2020-03-24 MED ORDER — DEXAMETHASONE SODIUM PHOSPHATE 4 MG/ML IJ SOLN
INTRAMUSCULAR | Status: AC
  Filled 2020-03-24: qty 2

## 2020-03-24 MED ORDER — FENTANYL CITRATE (PF) 100 MCG/2ML IJ SOLN
INTRAMUSCULAR | Status: AC
  Filled 2020-03-24: qty 2

## 2020-03-24 MED ORDER — LACTATED RINGERS IV SOLN: INTRAVENOUS | Status: DC

## 2020-03-24 MED ORDER — OXYTOCIN-SODIUM CHLORIDE 30-0.9 UT/500ML-% IV SOLN: 2.5000 [IU]/h | INTRAVENOUS | Status: AC

## 2020-03-24 MED ORDER — PHENYLEPHRINE HCL-NACL 20-0.9 MG/250ML-% IV SOLN
INTRAVENOUS | Status: AC
  Filled 2020-03-24: qty 250

## 2020-03-24 MED ORDER — SIMETHICONE 80 MG PO CHEW
80.0000 mg | CHEWABLE_TABLET | ORAL | Status: DC
  Administered 2020-03-24 – 2020-03-26 (×3): 80 mg via ORAL
  Filled 2020-03-24 (×2): qty 1

## 2020-03-24 MED ORDER — NALBUPHINE HCL 10 MG/ML IJ SOLN: 5.0000 mg | Freq: Once | INTRAMUSCULAR | Status: DC | PRN

## 2020-03-24 MED ORDER — PRENATAL MULTIVITAMIN CH
1.0000 | ORAL_TABLET | Freq: Every day | ORAL | Status: DC
  Administered 2020-03-25: 1 via ORAL
  Filled 2020-03-24: qty 1

## 2020-03-24 MED ORDER — NALBUPHINE HCL 10 MG/ML IJ SOLN: 5.0000 mg | INTRAMUSCULAR | Status: DC | PRN

## 2020-03-24 MED ORDER — DIPHENHYDRAMINE HCL 25 MG PO CAPS: 25.0000 mg | ORAL_CAPSULE | Freq: Four times a day (QID) | ORAL | Status: DC | PRN

## 2020-03-24 MED ORDER — NALOXONE HCL 4 MG/10ML IJ SOLN
1.0000 ug/kg/h | INTRAVENOUS | Status: DC | PRN
  Filled 2020-03-24: qty 5

## 2020-03-24 MED ORDER — MORPHINE SULFATE (PF) 0.5 MG/ML IJ SOLN
INTRAMUSCULAR | Status: AC
  Filled 2020-03-24: qty 10

## 2020-03-24 MED ORDER — CEFAZOLIN SODIUM-DEXTROSE 2-4 GM/100ML-% IV SOLN
INTRAVENOUS | Status: AC
  Filled 2020-03-24: qty 100

## 2020-03-24 MED ORDER — SODIUM CHLORIDE 0.9 % IR SOLN
Status: DC | PRN
  Administered 2020-03-24: 1

## 2020-03-24 MED ORDER — MEPERIDINE HCL 25 MG/ML IJ SOLN: 6.2500 mg | INTRAMUSCULAR | Status: DC | PRN

## 2020-03-24 MED ORDER — DIPHENHYDRAMINE HCL 25 MG PO CAPS: 25.0000 mg | ORAL_CAPSULE | ORAL | Status: DC | PRN

## 2020-03-24 MED ORDER — SIMETHICONE 80 MG PO CHEW: 80.0000 mg | CHEWABLE_TABLET | ORAL | Status: DC | PRN

## 2020-03-24 MED ORDER — HYDROMORPHONE HCL 1 MG/ML IJ SOLN: 0.2500 mg | INTRAMUSCULAR | Status: DC | PRN

## 2020-03-24 MED ORDER — OXYTOCIN-SODIUM CHLORIDE 30-0.9 UT/500ML-% IV SOLN
INTRAVENOUS | Status: DC | PRN
  Administered 2020-03-24: 300 mL via INTRAVENOUS

## 2020-03-24 MED ORDER — PROMETHAZINE HCL 25 MG/ML IJ SOLN: 6.2500 mg | INTRAMUSCULAR | Status: DC | PRN

## 2020-03-24 MED ORDER — DIBUCAINE (PERIANAL) 1 % EX OINT: 1.0000 "application " | TOPICAL_OINTMENT | CUTANEOUS | Status: DC | PRN

## 2020-03-24 MED ORDER — SIMETHICONE 80 MG PO CHEW
80.0000 mg | CHEWABLE_TABLET | Freq: Three times a day (TID) | ORAL | Status: DC
  Administered 2020-03-25 – 2020-03-26 (×4): 80 mg via ORAL
  Filled 2020-03-24 (×5): qty 1

## 2020-03-24 MED ORDER — METOCLOPRAMIDE HCL 5 MG/ML IJ SOLN
INTRAMUSCULAR | Status: AC
  Filled 2020-03-24: qty 2

## 2020-03-24 MED ORDER — MORPHINE SULFATE (PF) 0.5 MG/ML IJ SOLN
INTRAMUSCULAR | Status: DC | PRN
  Administered 2020-03-24: .15 mg via INTRATHECAL

## 2020-03-24 MED ORDER — OXYCODONE HCL 5 MG PO TABS: 5.0000 mg | ORAL_TABLET | ORAL | Status: DC | PRN

## 2020-03-24 MED ORDER — ONDANSETRON HCL 4 MG/2ML IJ SOLN: 4.0000 mg | Freq: Three times a day (TID) | INTRAMUSCULAR | Status: DC | PRN

## 2020-03-24 MED ORDER — ZOLPIDEM TARTRATE 5 MG PO TABS: 5.0000 mg | ORAL_TABLET | Freq: Every evening | ORAL | Status: DC | PRN

## 2020-03-24 MED ORDER — NALOXONE HCL 0.4 MG/ML IJ SOLN: 0.4000 mg | INTRAMUSCULAR | Status: DC | PRN

## 2020-03-24 MED ORDER — BUPIVACAINE IN DEXTROSE 0.75-8.25 % IT SOLN: INTRATHECAL | Status: DC | PRN

## 2020-03-24 MED ORDER — KETOROLAC TROMETHAMINE 30 MG/ML IJ SOLN
30.0000 mg | Freq: Four times a day (QID) | INTRAMUSCULAR | Status: AC | PRN
  Administered 2020-03-24: 30 mg via INTRAMUSCULAR

## 2020-03-24 MED ORDER — WITCH HAZEL-GLYCERIN EX PADS: 1.0000 "application " | MEDICATED_PAD | CUTANEOUS | Status: DC | PRN

## 2020-03-24 MED ORDER — PHENYLEPHRINE 40 MCG/ML (10ML) SYRINGE FOR IV PUSH (FOR BLOOD PRESSURE SUPPORT)
PREFILLED_SYRINGE | INTRAVENOUS | Status: AC
  Filled 2020-03-24: qty 10

## 2020-03-24 MED ORDER — ONDANSETRON HCL 4 MG/2ML IJ SOLN
INTRAMUSCULAR | Status: DC | PRN
  Administered 2020-03-24: 4 mg via INTRAVENOUS

## 2020-03-24 MED ORDER — KETOROLAC TROMETHAMINE 30 MG/ML IJ SOLN: 30.0000 mg | Freq: Once | INTRAMUSCULAR | Status: DC | PRN

## 2020-03-24 MED ORDER — OXYTOCIN-SODIUM CHLORIDE 30-0.9 UT/500ML-% IV SOLN
INTRAVENOUS | Status: AC
  Filled 2020-03-24: qty 500

## 2020-03-24 MED ORDER — FENTANYL CITRATE (PF) 100 MCG/2ML IJ SOLN
INTRAMUSCULAR | Status: DC | PRN
  Administered 2020-03-24: 15 ug via INTRATHECAL

## 2020-03-24 MED ORDER — SODIUM CHLORIDE 0.9% FLUSH: 3.0000 mL | INTRAVENOUS | Status: DC | PRN

## 2020-03-24 MED ORDER — DEXAMETHASONE SODIUM PHOSPHATE 4 MG/ML IJ SOLN
INTRAMUSCULAR | Status: DC | PRN
  Administered 2020-03-24: 8 mg via INTRAVENOUS

## 2020-03-24 MED ORDER — IBUPROFEN 800 MG PO TABS
800.0000 mg | ORAL_TABLET | Freq: Three times a day (TID) | ORAL | Status: DC
  Administered 2020-03-24 – 2020-03-26 (×5): 800 mg via ORAL
  Filled 2020-03-24 (×5): qty 1

## 2020-03-24 MED ORDER — ACETAMINOPHEN 500 MG PO TABS
1000.0000 mg | ORAL_TABLET | Freq: Four times a day (QID) | ORAL | Status: AC
  Administered 2020-03-24 – 2020-03-25 (×3): 1000 mg via ORAL
  Filled 2020-03-24 (×3): qty 2

## 2020-03-24 MED ORDER — DIPHENHYDRAMINE HCL 50 MG/ML IJ SOLN: 12.5000 mg | INTRAMUSCULAR | Status: DC | PRN

## 2020-03-24 MED ORDER — SCOPOLAMINE 1 MG/3DAYS TD PT72
1.0000 | MEDICATED_PATCH | Freq: Once | TRANSDERMAL | Status: DC
  Administered 2020-03-24: 1.5 mg via TRANSDERMAL

## 2020-03-24 MED ORDER — METOCLOPRAMIDE HCL 5 MG/ML IJ SOLN
INTRAMUSCULAR | Status: DC | PRN
  Administered 2020-03-24: 10 mg via INTRAVENOUS

## 2020-03-24 MED ORDER — PHENYLEPHRINE HCL-NACL 20-0.9 MG/250ML-% IV SOLN
INTRAVENOUS | Status: DC | PRN
  Administered 2020-03-24: 60 ug/min via INTRAVENOUS

## 2020-03-24 MED ORDER — MEASLES, MUMPS & RUBELLA VAC IJ SOLR: 0.5000 mL | Freq: Once | INTRAMUSCULAR | Status: DC

## 2020-03-24 MED ORDER — PRENATAL MULTIVITAMIN CH: 1.0000 | ORAL_TABLET | Freq: Every day | ORAL | Status: DC

## 2020-03-24 MED ORDER — SCOPOLAMINE 1 MG/3DAYS TD PT72
MEDICATED_PATCH | TRANSDERMAL | Status: AC
  Filled 2020-03-24: qty 1

## 2020-03-24 MED ORDER — KETOROLAC TROMETHAMINE 30 MG/ML IJ SOLN
INTRAMUSCULAR | Status: AC
  Filled 2020-03-24: qty 1

## 2020-03-24 MED ORDER — STERILE WATER FOR IRRIGATION IR SOLN
Status: DC | PRN
  Administered 2020-03-24: 1000 mL

## 2020-03-24 MED ORDER — BUPIVACAINE IN DEXTROSE 0.75-8.25 % IT SOLN
INTRATHECAL | Status: DC | PRN
  Administered 2020-03-24: 1.4 mL via INTRATHECAL

## 2020-03-24 MED ORDER — MENTHOL 3 MG MT LOZG: 1.0000 | LOZENGE | OROMUCOSAL | Status: DC | PRN

## 2020-03-24 MED ORDER — ONDANSETRON HCL 4 MG/2ML IJ SOLN
INTRAMUSCULAR | Status: AC
  Filled 2020-03-24: qty 2

## 2020-03-24 MED ORDER — CEFAZOLIN SODIUM-DEXTROSE 2-4 GM/100ML-% IV SOLN: 2.0000 g | INTRAVENOUS | Status: DC

## 2020-03-24 MED ORDER — CEFAZOLIN SODIUM-DEXTROSE 2-3 GM-%(50ML) IV SOLR
INTRAVENOUS | Status: DC | PRN
  Administered 2020-03-24: 2 g via INTRAVENOUS

## 2020-03-24 MED ORDER — POVIDONE-IODINE 10 % EX SWAB
2.0000 "application " | Freq: Once | CUTANEOUS | Status: AC
  Administered 2020-03-24: 2 via TOPICAL

## 2020-03-24 MED ORDER — LACTATED RINGERS IV SOLN: INTRAVENOUS | Status: DC | PRN

## 2020-03-24 MED ORDER — COCONUT OIL OIL: 1.0000 "application " | TOPICAL_OIL | Status: DC | PRN

## 2020-03-24 SURGICAL SUPPLY — 38 items
BENZOIN TINCTURE PRP APPL 2/3 (GAUZE/BANDAGES/DRESSINGS) ×3 IMPLANT
CHLORAPREP W/TINT 26ML (MISCELLANEOUS) ×3 IMPLANT
CLAMP CORD UMBIL (MISCELLANEOUS) IMPLANT
CLOSURE STERI-STRIP 1/2X4 (GAUZE/BANDAGES/DRESSINGS) ×1
CLOSURE WOUND 1/2 X4 (GAUZE/BANDAGES/DRESSINGS) ×1
CLOTH BEACON ORANGE TIMEOUT ST (SAFETY) ×3 IMPLANT
CLSR STERI-STRIP ANTIMIC 1/2X4 (GAUZE/BANDAGES/DRESSINGS) ×2 IMPLANT
DRSG OPSITE POSTOP 4X10 (GAUZE/BANDAGES/DRESSINGS) ×3 IMPLANT
ELECT REM PT RETURN 9FT ADLT (ELECTROSURGICAL) ×3
ELECTRODE REM PT RTRN 9FT ADLT (ELECTROSURGICAL) ×1 IMPLANT
EXTRACTOR VACUUM M CUP 4 TUBE (SUCTIONS) IMPLANT
EXTRACTOR VACUUM M CUP 4' TUBE (SUCTIONS)
GLOVE BIO SURGEON STRL SZ 6.5 (GLOVE) ×2 IMPLANT
GLOVE BIO SURGEONS STRL SZ 6.5 (GLOVE) ×1
GLOVE BIOGEL PI IND STRL 7.0 (GLOVE) ×1 IMPLANT
GLOVE BIOGEL PI INDICATOR 7.0 (GLOVE) ×2
GOWN STRL REUS W/TWL LRG LVL3 (GOWN DISPOSABLE) ×6 IMPLANT
KIT ABG SYR 3ML LUER SLIP (SYRINGE) IMPLANT
NEEDLE HYPO 25X5/8 SAFETYGLIDE (NEEDLE) IMPLANT
NS IRRIG 1000ML POUR BTL (IV SOLUTION) ×3 IMPLANT
PACK C SECTION WH (CUSTOM PROCEDURE TRAY) ×3 IMPLANT
PAD OB MATERNITY 4.3X12.25 (PERSONAL CARE ITEMS) ×3 IMPLANT
PENCIL SMOKE EVAC W/HOLSTER (ELECTROSURGICAL) ×3 IMPLANT
RTRCTR C-SECT PINK 25CM LRG (MISCELLANEOUS) ×3 IMPLANT
STRIP CLOSURE SKIN 1/2X4 (GAUZE/BANDAGES/DRESSINGS) ×2 IMPLANT
SUT MNCRL 0 VIOLET CTX 36 (SUTURE) ×2 IMPLANT
SUT MONOCRYL 0 CTX 36 (SUTURE) ×4
SUT PLAIN 1 NONE 54 (SUTURE) IMPLANT
SUT PLAIN 2 0 XLH (SUTURE) ×3 IMPLANT
SUT VIC AB 0 CT1 27 (SUTURE) ×4
SUT VIC AB 0 CT1 27XBRD ANBCTR (SUTURE) ×2 IMPLANT
SUT VIC AB 2-0 CT1 27 (SUTURE) ×2
SUT VIC AB 2-0 CT1 TAPERPNT 27 (SUTURE) ×1 IMPLANT
SUT VIC AB 4-0 KS 27 (SUTURE) ×3 IMPLANT
SYR BULB IRRIGATION 50ML (SYRINGE) ×3 IMPLANT
TOWEL OR 17X24 6PK STRL BLUE (TOWEL DISPOSABLE) ×3 IMPLANT
TRAY FOLEY W/BAG SLVR 14FR LF (SET/KITS/TRAYS/PACK) ×3 IMPLANT
WATER STERILE IRR 1000ML POUR (IV SOLUTION) ×3 IMPLANT

## 2020-03-24 NOTE — Interval H&P Note (Signed)
History and Physical Interval Note:  03/24/2020 8:28 AM  Nina Kent  has presented today for surgery, with the diagnosis of repeat c-section.  The various methods of treatment have been discussed with the patient and family. After consideration of risks, benefits and other options for treatment, the patient has consented to  Procedure(s) with comments: CESAREAN SECTION (N/A) - Heather,  RNFA as a surgical intervention.  The patient's history has been reviewed, patient examined, no change in status, stable for surgery.  I have reviewed the patient's chart and labs.  Questions were answered to the patient's satisfaction.     [name]

## 2020-03-24 NOTE — Anesthesia Preprocedure Evaluation (Signed)
Anesthesia Evaluation  Patient identified by MRN, date of birth, ID band Patient awake    Reviewed: Allergy & Precautions, NPO status , Patient's Chart, lab work & pertinent test results  Airway Mallampati: II  TM Distance: >3 FB Neck ROM: Full    Dental no notable dental hx.    Pulmonary shortness of breath,  Mild pulmonary edema on CXR   Pulmonary exam normal breath sounds clear to auscultation       Cardiovascular Normal cardiovascular exam Rhythm:Regular Rate:Normal     Neuro/Psych negative neurological ROS  negative psych ROS   GI/Hepatic negative GI ROS, Neg liver ROS,   Endo/Other  negative endocrine ROS  Renal/GU negative Renal ROS  negative genitourinary   Musculoskeletal negative musculoskeletal ROS (+)   Abdominal (+) + obese,   Peds negative pediatric ROS (+)  Hematology negative hematology ROS (+)   Anesthesia Other Findings   Reproductive/Obstetrics (+) Pregnancy Pre-eclamptic                             Anesthesia Physical  Anesthesia Plan  ASA: II  Anesthesia Plan: Spinal   Post-op Pain Management:    Induction:   PONV Risk Score and Plan: 3 and Ondansetron, Treatment may vary due to age or medical condition, Dexamethasone and Scopolamine patch - Pre-op  Airway Management Planned: Natural Airway and Nasal Cannula  Additional Equipment: None  Intra-op Plan:   Post-operative Plan:   Informed Consent: I have reviewed the patients History and Physical, chart, labs and discussed the procedure including the risks, benefits and alternatives for the proposed anesthesia with the patient or authorized representative who has indicated his/her understanding and acceptance.       Plan Discussed with:   Anesthesia Plan Comments:         Anesthesia Quick Evaluation

## 2020-03-24 NOTE — Transfer of Care (Signed)
Immediate Anesthesia Transfer of Care Note  Patient: Nina Kent  Procedure(s) Performed: CESAREAN SECTION (N/A )  Patient Location: PACU  Anesthesia Type:Spinal  Level of Consciousness: awake, alert  and oriented  Airway & Oxygen Therapy: Patient Spontanous Breathing  Post-op Assessment: Report given to RN and Post -op Vital signs reviewed and stable  Post vital signs: Reviewed and stable  Last Vitals:  Vitals Value Taken Time  BP 106/50 03/24/20 1047  Temp    Pulse 100 03/24/20 1048  Resp 18 03/24/20 1048  SpO2 95 % 03/24/20 1048  Vitals shown include unvalidated device data.  Last Pain:  Vitals:   03/24/20 0817  TempSrc: Oral         Complications: No complications documented.

## 2020-03-24 NOTE — Lactation Note (Signed)
This note was copied from a baby's chart. Lactation Consultation Note  Patient Name: Nina Kent ZOXWR'U Date: 03/24/2020 Reason for consult: Initial assessment;Term;1st time breastfeeding  P2 mother whose infant is now 55 hours old.  This is a term baby at 39+4 weeks.  Mother did not breast feed her first child but she pumped and bottle fed for 3 months.    Baby was swaddled and asleep in the bassinet when I arrived.  Discussed basic breast feeding concepts with the parents.  Taught hand expression and mother was unable to express any colostrum drops at this time.  Container provided and milk storage times reviewed.  During my discussion baby began to arouse.  Offered to assist with latching and mother agreeable.  Discussed feeding STS and unswaddled baby.  Positioned pillows appropriately and assisted to latch in the football hold on the left breast easily.  Baby had a wide gape and flanged lips.  He began sucking rhythmically and continued with only gentle stimulation.  An occasional swallow noted.  Demonstrated breast compressions.  Observed baby feeding for 12 minutes prior to leaving the room.  Mother will continue to feed 8-12 times/24 hours or sooner if baby shows cues.  She will call her RN/LC for assistance as needed.   Mom made aware of O/P services, breastfeeding support groups, community resources, and our phone # for post-discharge questions. Mother has a DEBP for home use.  Father present.     Maternal Data Formula Feeding for Exclusion: No Has patient been taught Hand Expression?: Yes Does the patient have breastfeeding experience prior to this delivery?: No (Mother pumped and bottle fed for three months)  Feeding Feeding Type: Breast Fed  LATCH Score Latch: Grasps breast easily, tongue down, lips flanged, rhythmical sucking.  Audible Swallowing: A few with stimulation  Type of Nipple: Everted at rest and after stimulation (short shafted)  Comfort (Breast/Nipple):  Soft / non-tender  Hold (Positioning): Assistance needed to correctly position infant at breast and maintain latch.  LATCH Score: 8  Interventions Interventions: Breast feeding basics reviewed;Assisted with latch;Skin to skin;Breast massage;Hand express;Breast compression;Adjust position;Position options;Support pillows  Lactation Tools Discussed/Used     Consult Status Consult Status: Follow-up Date: 03/25/20 Follow-up type: In-patient    Eilee Schader R Alizea Pell 03/24/2020, 3:10 PM

## 2020-03-24 NOTE — Anesthesia Postprocedure Evaluation (Signed)
Anesthesia Post Note  Patient: Nina Kent  Procedure(s) Performed: CESAREAN SECTION (N/A )     Patient location during evaluation: PACU Anesthesia Type: Spinal Level of consciousness: awake Pain management: pain level controlled Vital Signs Assessment: post-procedure vital signs reviewed and stable Respiratory status: spontaneous breathing Cardiovascular status: stable Postop Assessment: no headache, no backache, spinal receding, patient able to bend at knees and no apparent nausea or vomiting Anesthetic complications: no   No complications documented.  Last Vitals:  Vitals:   03/24/20 1130 03/24/20 1145  BP: 127/72 118/75  Pulse: 86 85  Resp: 19 12  Temp: 36.6 C   SpO2: 97% 98%    Last Pain:  Vitals:   03/24/20 1145  TempSrc:   PainSc: 0-No pain   Pain Goal:    LLE Motor Response: Purposeful movement (03/24/20 1145)   RLE Motor Response: Purposeful movement (03/24/20 1145)       Epidural/Spinal Function Cutaneous sensation: Able to Discern Pressure (03/24/20 1145), Patient able to flex knees: Yes (03/24/20 1145), Patient able to lift hips off bed: No (03/24/20 1145), Back pain beyond tenderness at insertion site: No (03/24/20 1145), Progressively worsening motor and/or sensory loss: No (03/24/20 1145), Bowel and/or bladder incontinence post epidural: No (03/24/20 1145)  Caren Macadam

## 2020-03-24 NOTE — Op Note (Signed)
NAME: Nina Kent, Nina Kent MEDICAL RECORD ZO:[id number] ACCOUNT [id number] DATE OF BIRTH:04-Aug-1991 FACILITY: [facility] LOCATION: [facility] PHYSICIAN:Theodoro Koval BOVARD-STUCKERT, MD  OPERATIVE REPORT  DATE OF PROCEDURE:  03/24/2020  PREOPERATIVE DIAGNOSIS:  Intrauterine pregnancy at term, declines trial of labor, for repeat cesarean section.  POSTOPERATIVE DIAGNOSIS:    Intrauterine pregnancy at term, declines trial of labor, for repeat cesarean section, delivered.  PROCEDURE:  Repeat low transverse cesarean section.  SURGEON:  Sherian Rein, MD  ASSISTANT:   Jerilynn Birkenhead, MD   COMPLICATIONS:  None.  PATHOLOGY:  Placenta to labor and delivery.  ESTIMATED BLOOD LOSS:  206 mL.   IV FLUID AND URINE OUTPUT:  Per anesthesia.  FINDINGS:  Viable female infant,  named ____ , at 10:06 a.m. with Apgars of 8 at one minute and 9 at five minutes and a weight pending at the time of dictation.  Normal uterus, tubes and ovaries are noted.  DESCRIPTION OF PROCEDURE:  After informed consent was reviewed with the patient, including risks, benefits and alternatives of the surgical procedure, she was transported to the operating room where spinal anesthesia was placed and found to be adequate.   She was then returned to the supine position with a leftward tilt, prepped and draped in the normal sterile fashion.  After an appropriate level had been achieved, a timeout was performed before starting the procedure.  Pfannenstiel skin incision was  made at the level of her previous incision, carried through to the underlying layer of fascia sharply.  The fascia was incised in the midline and the incision was extended laterally with Mayo scissors.  The superior aspect of the fascial incision was  grasped with Kocher clamps, elevated and the rectus muscles were dissected off both bluntly and sharply.  Midline was easily identified and entered sharply.  The incision was extended superiorly and inferiorly with good  visualization of the bladder.   Alexis skin retractor was placed, carefully making sure that no bowel was entrapped.  The uterus was explored.  Bladder flap was created both digitally and sharply and the uterus was incised in a transverse fashion and this was extended.  The infant was  delivered from vertex presentation.  Nose and mouth were suctioned on the field.  Cord was clamped and cut after a minute.  Infant was handed off to the waiting pediatric staff.  The placenta was expressed from the uterus.  The uterus was cleared of all  clot and debris.  The uterine incision was closed in 2 layers with 0 Monocryl, the first of which was running locked and the second as an imbricating layer.  The serosa was made hemostatic with Bovie cautery.  The adnexa were investigated and found to be  normal.  The clot and debris was cleared from the gutters.  The peritoneum was reapproximated with 2-0 Vicryl.  The subfascial planes were inspected and found to be hemostatic.  The fascia was then reapproximated with 0 Vicryl in a running fashion.  The  subcuticular adipose tissue was made hemostatic with Bovie cautery.  The dead space was closed with 3-0 Vicryl.  Skin was closed with 4-0 Vicryl in a subcuticular fashion.  Benzoin and Steri-Strips were applied.  The patient tolerated the procedure  well.  Sponge, lap, and needle count was correct x 2 per the operating staff.  VN/NUANCE  D:03/24/2020 T:03/24/2020 JOB:[id number]

## 2020-03-24 NOTE — Anesthesia Procedure Notes (Signed)
Spinal  Patient location during procedure: OR Start time: 03/24/2020 9:40 AM End time: 03/24/2020 9:45 AM Staffing Performed: anesthesiologist  Anesthesiologist: Leilani Able, MD Preanesthetic Checklist Completed: patient identified, IV checked, site marked, risks and benefits discussed, surgical consent, monitors and equipment checked, pre-op evaluation and timeout performed Spinal Block Patient position: sitting Prep: DuraPrep and site prepped and draped Patient monitoring: continuous pulse ox and blood pressure Approach: midline Location: L3-4 Injection technique: single-shot Needle Needle type: Pencan  Needle gauge: 24 G Needle length: 10 cm Needle insertion depth: 6 cm Assessment Sensory level: T4

## 2020-03-24 NOTE — Brief Op Note (Signed)
03/24/2020  10:44 AM  PATIENT:  Irish Lack Riccobono  29 y.o. female  PRE-OPERATIVE DIAGNOSIS:  repeat c-section  POST-OPERATIVE DIAGNOSIS:  repeat c-section  PROCEDURE:  Procedure(s) with comments: CESAREAN SECTION (N/A) - Heather,  RNFA  SURGEON:  Surgeon(s) and Role:    * Bovard-Stuckert, Augusto Gamble, MD - Primary    * Fair, Hoyle Sauer, MD - Assisting  ANESTHESIA:   spinal  EBL:  206cc  IVF and uop per anesthesia.    FINDINGS: viable female infant, Kason, at 10:06, apgars  8/9, wt P; nl uterus, tubes and ovaries  BLOOD ADMINISTERED:none  DRAINS: Urinary Catheter (Foley)   LOCAL MEDICATIONS USED:  NONE  SPECIMEN:  Source of Specimen:  Placenta  DISPOSITION OF SPECIMEN:  L&D  COUNTS:  YES  TOURNIQUET:  * No tourniquets in log *  DICTATION: .Other Dictation: Dictation Number [id number]  PLAN OF CARE: Admit to inpatient   PATIENT DISPOSITION:  PACU - hemodynamically stable.   Delay start of Pharmacological VTE agent (>24hrs) due to surgical blood loss or risk of bleeding: not applicable

## 2020-03-25 LAB — CBC
HCT: 27.8 % — ABNORMAL LOW (ref 36.0–46.0)
Hemoglobin: 9.1 g/dL — ABNORMAL LOW (ref 12.0–15.0)
MCH: 29.5 pg (ref 26.0–34.0)
MCHC: 32.7 g/dL (ref 30.0–36.0)
MCV: 90.3 fL (ref 80.0–100.0)
Platelets: 193 10*3/uL (ref 150–400)
RBC: 3.08 MIL/uL — ABNORMAL LOW (ref 3.87–5.11)
RDW: 12.8 % (ref 11.5–15.5)
WBC: 9.4 10*3/uL (ref 4.0–10.5)
nRBC: 0 % (ref 0.0–0.2)

## 2020-03-25 LAB — BIRTH TISSUE RECOVERY COLLECTION (PLACENTA DONATION)

## 2020-03-25 MED ORDER — DOCUSATE SODIUM 100 MG PO CAPS
100.0000 mg | ORAL_CAPSULE | Freq: Two times a day (BID) | ORAL | Status: DC
  Administered 2020-03-25 (×2): 100 mg via ORAL
  Filled 2020-03-25 (×2): qty 1

## 2020-03-25 MED ORDER — FERROUS SULFATE 325 (65 FE) MG PO TABS
325.0000 mg | ORAL_TABLET | Freq: Every day | ORAL | Status: DC
  Administered 2020-03-25 – 2020-03-26 (×2): 325 mg via ORAL
  Filled 2020-03-25 (×2): qty 1

## 2020-03-25 NOTE — Lactation Note (Signed)
This note was copied from a baby's chart. Lactation Consultation Note  Patient Name: Nina Kent SHUOH'F Date: 03/25/2020  Baby Nina Warden Fillers now 53 hours old born via csection with 6 percent weight loss first night.  Infant asleep on arrival, but asked mom if we could try and feed.  Mom agreed.  Unswaddled him and gave him a few drops of colosotrum from spoon.  Infant started cuing and assisted with latchcing on right breast in cross cradle hold.  Infant latched and was very sleepy. Mom reports comfort . A few sucks.No swallows  Noted. He fell asleep.  Minimal assist with mom in cross cradle hold on left breast.  Mom has small red abrasion and scab on tip of left nipple.  Infant with rythmic sucking and audible swallows on left breast. Infant breastfed about 20 minutes on left. When infant came off nipple not mishapen however abrasion on tip slightly red and bloody.  LC left mom breastfeeding while LC got supplies to initiate prepumping and pumping with DEBP. Mom reports it started hurting while LC gone and he came off. Urged to hand express and pat expressed mothers milk on nipples/air dry.  Initiated pumping with DEBP with mom. Demo using DEBP.  Urged mom to hand express and pump past breastfeedings and feed back all expressed mothers milk. Urged to call lactation as needed. Maternal Data    Feeding Feeding Type: Breast Fed  LATCH Score    Audible Swallowing: A few with stimulation        Hold (Positioning): No assistance needed to correctly position infant at breast.     Interventions    Lactation Tools Discussed/Used     Consult Status      Neomia Dear 03/25/2020, 1:55 PM

## 2020-03-25 NOTE — Progress Notes (Signed)
POSTPARTUM POSTOP PROGRESS NOTE  POD #1  Subjective:  No acute events overnight.  Pt denies problems with ambulating, voiding or po intake.  She denies nausea or vomiting.  Pain is well controlled.  She has not had flatus. She has not had bowel movement.  Lochia Minimal. Desires circ, no complaints this AM. Denies SOB, palpitations dizziness while ambulating   Objective: Blood pressure 110/69, pulse 71, temperature 98.2 F (36.8 C), temperature source Oral, resp. rate 19, height 5\' 6"  (1.676 m), SpO2 98 %, currently breastfeeding.  Physical Exam:  General: alert, cooperative and no distress Lochia:normal flow Chest: CTAB Heart: RRR no m/r/g Abdomen: +BS, soft, nontender Uterine Fundus: firm, 2cm below umbilicus. Honeycomb dressing intact, minimal drainage Extremities: neg edema, neg calf TTP BL, neg Homans BL  Recent Labs    03/25/20 0627  HGB 9.1*  HCT 27.8*    Assessment/Plan:  ASSESSMENT: Nina Kent is a 29 y.o. G2P1101 s/p RLTCS @ [redacted]w[redacted]d for h/o csx x1 (PreE, breech). PNC c/b H/o PreE, csx x1 and RNI status.   Plan for discharge tomorrow, Breastfeeding, Lactation consult and Circumcision prior to discharge  ppMMR prior to discharge. Circumcision scheduled for 4PM today   LOS: 1 day

## 2020-03-26 MED ORDER — IBUPROFEN 800 MG PO TABS: 800.0000 mg | ORAL_TABLET | Freq: Three times a day (TID) | ORAL | 0 refills | Status: AC

## 2020-03-26 NOTE — Discharge Instructions (Signed)
As per discharge pamphlet °

## 2020-03-26 NOTE — Progress Notes (Signed)
AVS printed and discharged instructions given to pt. Pt instructed to call for follow up appointment. All questions answered and pt verbalized understanding.

## 2020-03-26 NOTE — Discharge Summary (Signed)
Postpartum Discharge Summary      Patient Name: Nina Kent DOB: 05/17/1991 MRN: [id number]  Date of admission: 03/24/2020 Delivery date:03/24/2020  Delivering provider: Sherian Rein  Date of discharge: 03/26/2020  Admitting diagnosis: Status post repeat low transverse cesarean section [Z98.891] Intrauterine pregnancy: [redacted]w[redacted]d     Secondary diagnosis:  Principal Problem:   Status post repeat low transverse cesarean section    Discharge diagnosis: Term Pregnancy Delivered                                              Hospital course: Sceduled C/S   [name] was admitted to the hospital 03/24/2020 for scheduled cesarean section with the following indication:Elective Repeat.Delivery details are as follows:  Membrane Rupture Time/Date: 10:08 AM ,03/24/2020   Delivery Method:C-Section, Low Transverse  Details of operation can be found in separate operative note.  Patient had an uncomplicated postpartum course.  She is ambulating, tolerating a regular diet, passing flatus, and urinating well. Patient is discharged home in stable condition on  03/26/20        Newborn Data: Birth date:03/24/2020  Birth time:10:09 AM  Gender:Female  Living status:Living  Apgars:8 ,9  Weight:3535 g      Physical exam  Vitals:   03/25/20 1150 03/25/20 1359 03/25/20 2116 03/26/20 0513  BP: 122/76 115/72 137/81 127/88  Pulse: 86 93 84 88  Resp:  19 15 16   Temp: 98.9 F (37.2 C) 98.7 F (37.1 C) 98.6 F (37 C) 98.1 F (36.7 C)  TempSrc: Oral Oral Oral Oral  SpO2: 98%   99%  Height:       General: alert Lochia: appropriate Uterine Fundus: firm Incision: Healing well with no significant drainage  Labs: Lab Results  Component Value Date   WBC 9.4 03/25/2020   HGB 9.1 (L) 03/25/2020   HCT 27.8 (L) 03/25/2020   MCV 90.3 03/25/2020   PLT 193 03/25/2020   CMP Latest Ref Rng & Units 06/16/2017  Glucose 65 - 99 mg/dL -  BUN 6 - 20 mg/dL -  Creatinine 06/18/2017 - 2.29 mg/dL 7.98   Sodium 9.21 - 194 mmol/L -  Potassium 3.5 - 5.1 mmol/L -  Chloride 101 - 111 mmol/L -  CO2 22 - 32 mmol/L -  Calcium 8.9 - 10.3 mg/dL -  Total Protein 6.5 - 8.1 g/dL 174)  Total Bilirubin 0.3 - 1.2 mg/dL 0.8(X)  Alkaline Phos 38 - 126 U/L 69  AST 15 - 41 U/L 20  ALT 14 - 54 U/L 13(L)   Edinburgh Score: Edinburgh Postnatal Depression Scale Screening Tool 03/25/2020  I have been able to laugh and see the funny side of things. 0  I have looked forward with enjoyment to things. 0  I have blamed myself unnecessarily when things went wrong. 1  I have been anxious or worried for no good reason. 1  I have felt scared or panicky for no good reason. 0  Things have been getting on top of me. 0  I have been so unhappy that I have had difficulty sleeping. 0  I have felt sad or miserable. 0  I have been so unhappy that I have been crying. 0  The thought of harming myself has occurred to me. 0  Edinburgh Postnatal Depression Scale Total 2      After visit  meds:  Allergies as of 03/26/2020   No Known Allergies     Medication List    STOP taking these medications   aspirin EC 81 MG tablet     TAKE these medications   ibuprofen 800 MG tablet Commonly known as: ADVIL Take 1 tablet (800 mg total) by mouth every 8 (eight) hours.   prenatal multivitamin Tabs tablet Take 1 tablet by mouth daily at 12 noon.        Discharge home in stable condition Infant Feeding: Breast Infant Disposition:home with mother Discharge instruction: per After Visit Summary and Postpartum booklet. Activity: Advance as tolerated. Pelvic rest for 6 weeks.  Diet: routine diet  Postpartum Appointment:2 weeks Follow up Visit:  Follow-up Information    Bovard-Stuckert, Jody, MD. Schedule an appointment as soon as possible for a visit in 2 week(s).   Specialty: Obstetrics and Gynecology Contact information: [address] [place] [address] [phone]                    03/26/2020 Nina Niece, MD

## 2020-03-26 NOTE — Progress Notes (Signed)
POD #2 LTCS Doing well, wants to go home Afeb, VSS Abd- soft, fundus firm, incision intact Will d/c home

## 2020-03-26 NOTE — Lactation Note (Signed)
This note was copied from a baby's chart. Lactation Consultation Note  Patient Name: Nina Kent [id number] Date: 03/26/2020 Reason for consult: Follow-up assessment   Baby 48 hours old.  Mother states baby is wanting to feed frequently. Discussed cluster feeding, feeding on both breasts and frequency. Reviewed engorgement care and monitoring voids/stools. Suggest mother call if she has questions once home.    Maternal Data    Feeding Feeding Type: Breast Fed  LATCH Score                   Interventions Interventions: Breast feeding basics reviewed  Lactation Tools Discussed/Used     Consult Status Consult Status: Complete Date: 03/26/20    Dahlia Byes [facility] 03/26/2020, 10:58 AM

## 2020-03-26 NOTE — Lactation Note (Addendum)
This note was copied from a baby's chart. Lactation Consultation Note  Patient Name: Boy Yaneli Keithley IWLNL'G Date: 03/26/2020 Reason for consult: Follow-up assessment   P2,37 hour term female infant with -6% weight loss.  LC entered room mom was sitting in chair. Per mom, LC earlier [facility]) helped with latch and infant is latching better now. LC did not observe latch, per mom, infant recently finished BF for 30 minutes prior to [facility] entering the room. Per mom ,infant is currently cluster feeding. LC reviewed hand expression and mom easily expressed 3 mls of colostrum in a bullet that mom will offer infant at next feeding. LC gave mom comfort gels due previous abrasions on nipples, mom understands not to use with coconut oil. Mom knows to call RN or LC if she has any questions, concerns or needs assistance with latching infant at breast.   Maternal Data    Feeding    LATCH Score                   Interventions Interventions: Skin to skin;Breast massage;Comfort gels;Hand express  Lactation Tools Discussed/Used     Consult Status Consult Status: Follow-up Date: 03/26/20 Follow-up type: In-patient    Danelle Earthly 03/26/2020, 3:18 AM
